# Patient Record
Sex: Male | Born: 1935
Health system: Southern US, Community
[De-identification: ages and names within clinical notes are randomized; demographics above are authoritative.]

## PROBLEM LIST (undated history)

## (undated) DIAGNOSIS — E559 Vitamin D deficiency, unspecified: Secondary | ICD-10-CM

## (undated) DIAGNOSIS — R39198 Other difficulties with micturition: Secondary | ICD-10-CM

## (undated) DIAGNOSIS — K259 Gastric ulcer, unspecified as acute or chronic, without hemorrhage or perforation: Secondary | ICD-10-CM

## (undated) DIAGNOSIS — F419 Anxiety disorder, unspecified: Secondary | ICD-10-CM

## (undated) DIAGNOSIS — M720 Palmar fascial fibromatosis [Dupuytren]: Secondary | ICD-10-CM

## (undated) DIAGNOSIS — M549 Dorsalgia, unspecified: Secondary | ICD-10-CM

## (undated) DIAGNOSIS — G4700 Insomnia, unspecified: Secondary | ICD-10-CM

## (undated) DIAGNOSIS — L74519 Primary focal hyperhidrosis, unspecified: Secondary | ICD-10-CM

## (undated) DIAGNOSIS — I10 Essential (primary) hypertension: Secondary | ICD-10-CM

## (undated) DIAGNOSIS — K5790 Diverticulosis of intestine, part unspecified, without perforation or abscess without bleeding: Secondary | ICD-10-CM

## (undated) DIAGNOSIS — M533 Sacrococcygeal disorders, not elsewhere classified: Secondary | ICD-10-CM

## (undated) DIAGNOSIS — E785 Hyperlipidemia, unspecified: Secondary | ICD-10-CM

## (undated) DIAGNOSIS — I440 Atrioventricular block, first degree: Secondary | ICD-10-CM

## (undated) DIAGNOSIS — L03119 Cellulitis of unspecified part of limb: Secondary | ICD-10-CM

## (undated) DIAGNOSIS — I2510 Atherosclerotic heart disease of native coronary artery without angina pectoris: Secondary | ICD-10-CM

## (undated) DIAGNOSIS — K645 Perianal venous thrombosis: Secondary | ICD-10-CM

## (undated) DIAGNOSIS — L570 Actinic keratosis: Secondary | ICD-10-CM

## (undated) DIAGNOSIS — H9190 Unspecified hearing loss, unspecified ear: Secondary | ICD-10-CM

## (undated) HISTORY — DX: Unspecified hearing loss, unspecified ear: H91.90

## (undated) HISTORY — DX: Anxiety disorder, unspecified: F41.9

## (undated) HISTORY — DX: Other difficulties with micturition: R39.198

## (undated) HISTORY — DX: Hyperlipidemia, unspecified: E78.5

## (undated) HISTORY — DX: Perianal venous thrombosis: K64.5

## (undated) HISTORY — DX: Dorsalgia, unspecified: M54.9

## (undated) HISTORY — DX: Diverticulosis of intestine, part unspecified, without perforation or abscess without bleeding: K57.90

## (undated) HISTORY — DX: Sacrococcygeal disorders, not elsewhere classified: M53.3

## (undated) HISTORY — DX: Vitamin D deficiency, unspecified: E55.9

## (undated) HISTORY — DX: Essential (primary) hypertension: I10

## (undated) HISTORY — DX: Primary focal hyperhidrosis, unspecified: L74.519

## (undated) HISTORY — DX: Palmar fascial fibromatosis (dupuytren): M72.0

## (undated) HISTORY — DX: Gastric ulcer, unspecified as acute or chronic, without hemorrhage or perforation: K25.9

## (undated) HISTORY — DX: Cellulitis of unspecified part of limb: L03.119

## (undated) HISTORY — DX: Actinic keratosis: L57.0

## (undated) HISTORY — DX: Atrioventricular block, first degree: I44.0

## (undated) HISTORY — DX: Atherosclerotic heart disease of native coronary artery without angina pectoris: I25.10

## (undated) HISTORY — DX: Insomnia, unspecified: G47.00

---

## 1969-06-25 HISTORY — PX: RECTAL POLYPECTOMY: SHX2309

## 1978-06-25 DIAGNOSIS — K5790 Diverticulosis of intestine, part unspecified, without perforation or abscess without bleeding: Secondary | ICD-10-CM

## 1978-06-25 HISTORY — DX: Diverticulosis of intestine, part unspecified, without perforation or abscess without bleeding: K57.90

## 1988-06-25 HISTORY — PX: CATARACT EXTRACTION W/ INTRAOCULAR LENS IMPLANT: SHX1309

## 1990-06-25 HISTORY — PX: CATARACT EXTRACTION W/ INTRAOCULAR LENS IMPLANT: SHX1309

## 1996-06-25 HISTORY — PX: CORONARY ARTERY BYPASS GRAFT: SHX141

## 2001-04-30 DIAGNOSIS — K645 Perianal venous thrombosis: Secondary | ICD-10-CM

## 2001-04-30 HISTORY — DX: Perianal venous thrombosis: K64.5

## 2001-06-25 HISTORY — PX: COLONOSCOPY: SHX174

## 2001-08-20 ENCOUNTER — Ambulatory Visit (HOSPITAL_COMMUNITY): Admission: RE | Admit: 2001-08-20 | Discharge: 2001-08-20 | Payer: Self-pay | Admitting: *Deleted

## 2001-10-29 ENCOUNTER — Ambulatory Visit (HOSPITAL_COMMUNITY): Admission: RE | Admit: 2001-10-29 | Discharge: 2001-10-29 | Payer: Self-pay | Admitting: *Deleted

## 2001-12-30 DIAGNOSIS — K259 Gastric ulcer, unspecified as acute or chronic, without hemorrhage or perforation: Secondary | ICD-10-CM

## 2001-12-30 HISTORY — DX: Gastric ulcer, unspecified as acute or chronic, without hemorrhage or perforation: K25.9

## 2005-01-16 DIAGNOSIS — H9190 Unspecified hearing loss, unspecified ear: Secondary | ICD-10-CM

## 2005-01-16 HISTORY — DX: Unspecified hearing loss, unspecified ear: H91.90

## 2007-08-04 DIAGNOSIS — R39198 Other difficulties with micturition: Secondary | ICD-10-CM

## 2007-08-04 HISTORY — DX: Other difficulties with micturition: R39.198

## 2007-08-06 DIAGNOSIS — L74519 Primary focal hyperhidrosis, unspecified: Secondary | ICD-10-CM

## 2007-08-06 DIAGNOSIS — M720 Palmar fascial fibromatosis [Dupuytren]: Secondary | ICD-10-CM

## 2007-08-06 DIAGNOSIS — I440 Atrioventricular block, first degree: Secondary | ICD-10-CM

## 2007-08-06 HISTORY — DX: Palmar fascial fibromatosis (dupuytren): M72.0

## 2007-08-06 HISTORY — DX: Atrioventricular block, first degree: I44.0

## 2007-08-06 HISTORY — DX: Primary focal hyperhidrosis, unspecified: L74.519

## 2009-05-27 DIAGNOSIS — E559 Vitamin D deficiency, unspecified: Secondary | ICD-10-CM

## 2009-05-27 HISTORY — DX: Vitamin D deficiency, unspecified: E55.9

## 2009-08-03 DIAGNOSIS — L570 Actinic keratosis: Secondary | ICD-10-CM

## 2009-08-03 HISTORY — DX: Actinic keratosis: L57.0

## 2010-01-31 DIAGNOSIS — M533 Sacrococcygeal disorders, not elsewhere classified: Secondary | ICD-10-CM

## 2010-01-31 HISTORY — DX: Sacrococcygeal disorders, not elsewhere classified: M53.3

## 2010-08-24 ENCOUNTER — Ambulatory Visit
Admission: RE | Admit: 2010-08-24 | Discharge: 2010-08-24 | Disposition: A | Discharge status: Home / Self Care | Payer: Medicare Other | Source: Ambulatory Visit | Attending: Internal Medicine | Admitting: Internal Medicine

## 2010-08-24 ENCOUNTER — Other Ambulatory Visit: Payer: Self-pay | Admitting: Internal Medicine

## 2010-08-24 DIAGNOSIS — J40 Bronchitis, not specified as acute or chronic: Secondary | ICD-10-CM

## 2010-12-13 ENCOUNTER — Other Ambulatory Visit: Payer: Self-pay | Admitting: Internal Medicine

## 2010-12-13 ENCOUNTER — Ambulatory Visit
Admission: RE | Admit: 2010-12-13 | Discharge: 2010-12-13 | Disposition: A | Discharge status: Home / Self Care | Payer: Medicare Other | Source: Ambulatory Visit | Attending: Internal Medicine | Admitting: Internal Medicine

## 2010-12-13 DIAGNOSIS — Z09 Encounter for follow-up examination after completed treatment for conditions other than malignant neoplasm: Secondary | ICD-10-CM

## 2012-09-19 IMAGING — CR DG CHEST 2V
2 series · 2 of 2 positions shown · non-contrast
Comparison: 08/24/2010.

CLINICAL DATA: 85-year-old male 3-month follow-up of indistinct
left lung base opacity.  Recent fall with anterior ribs soreness.

CHEST - 2 VIEW

[view not recorded (1 of 2)]
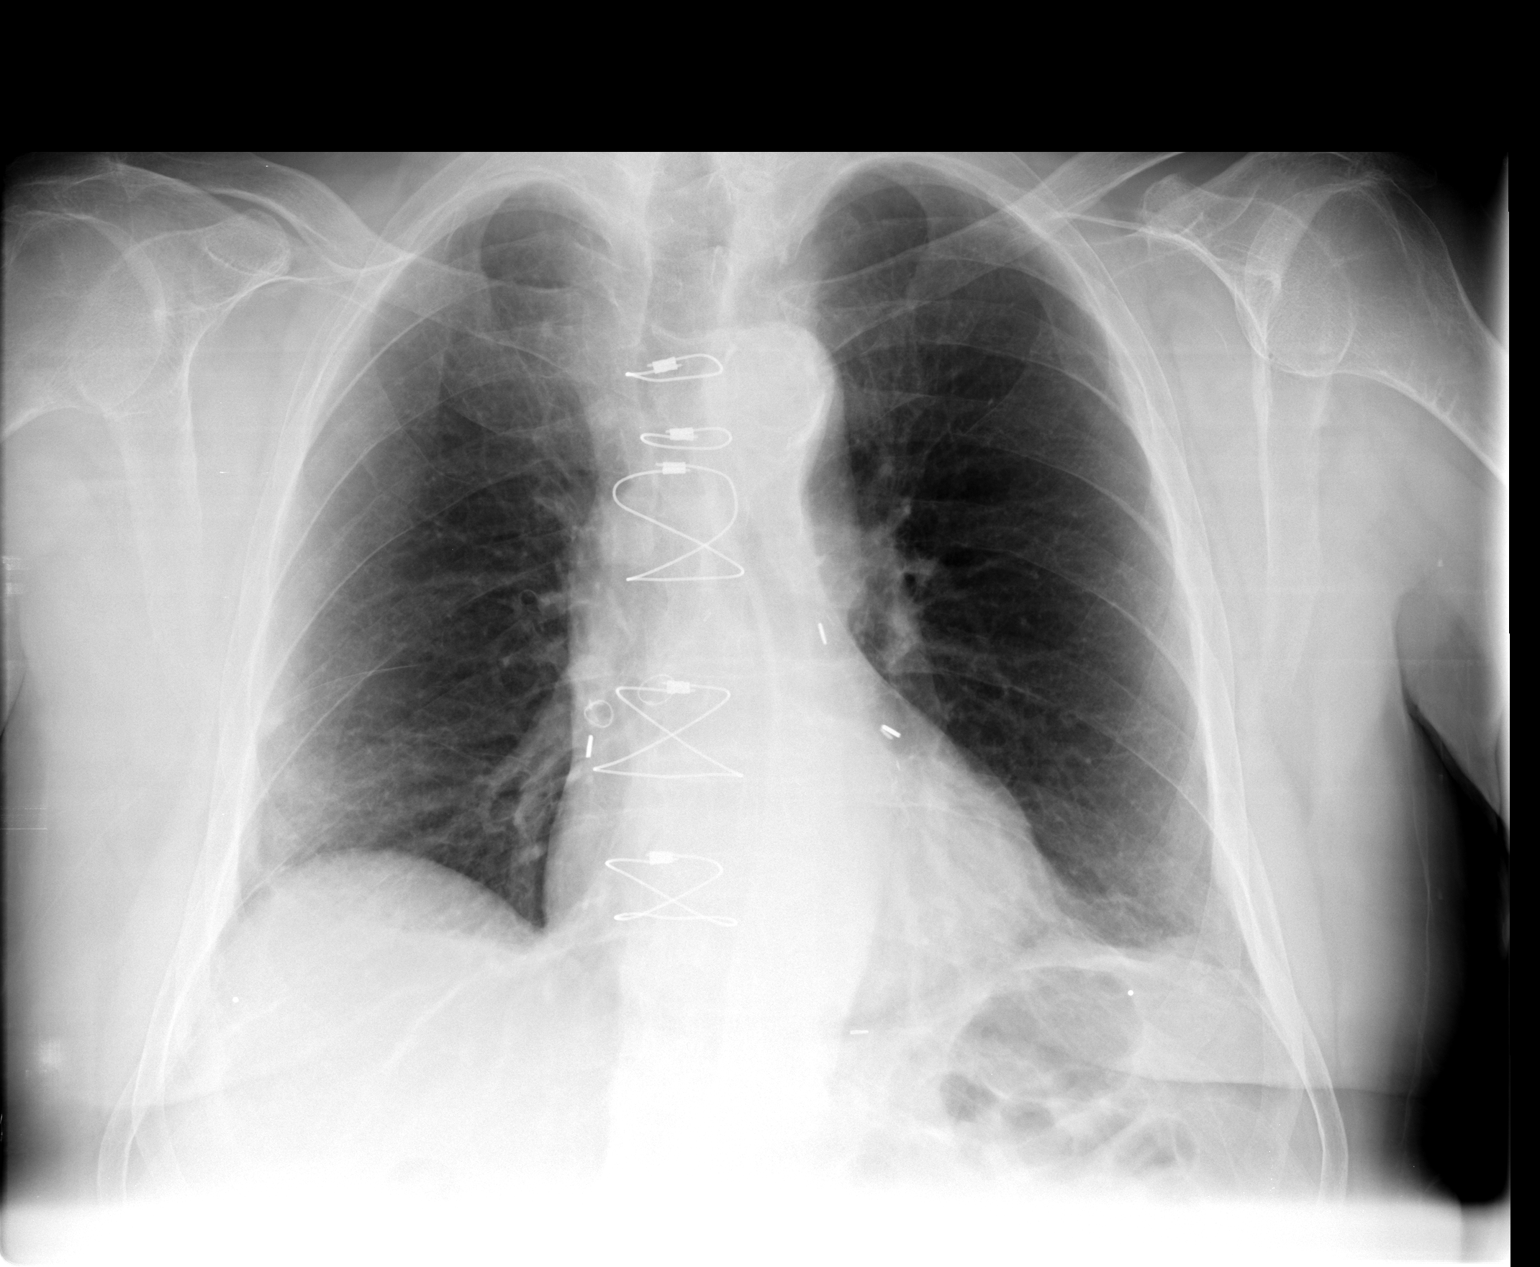

[view not recorded (2 of 2)]
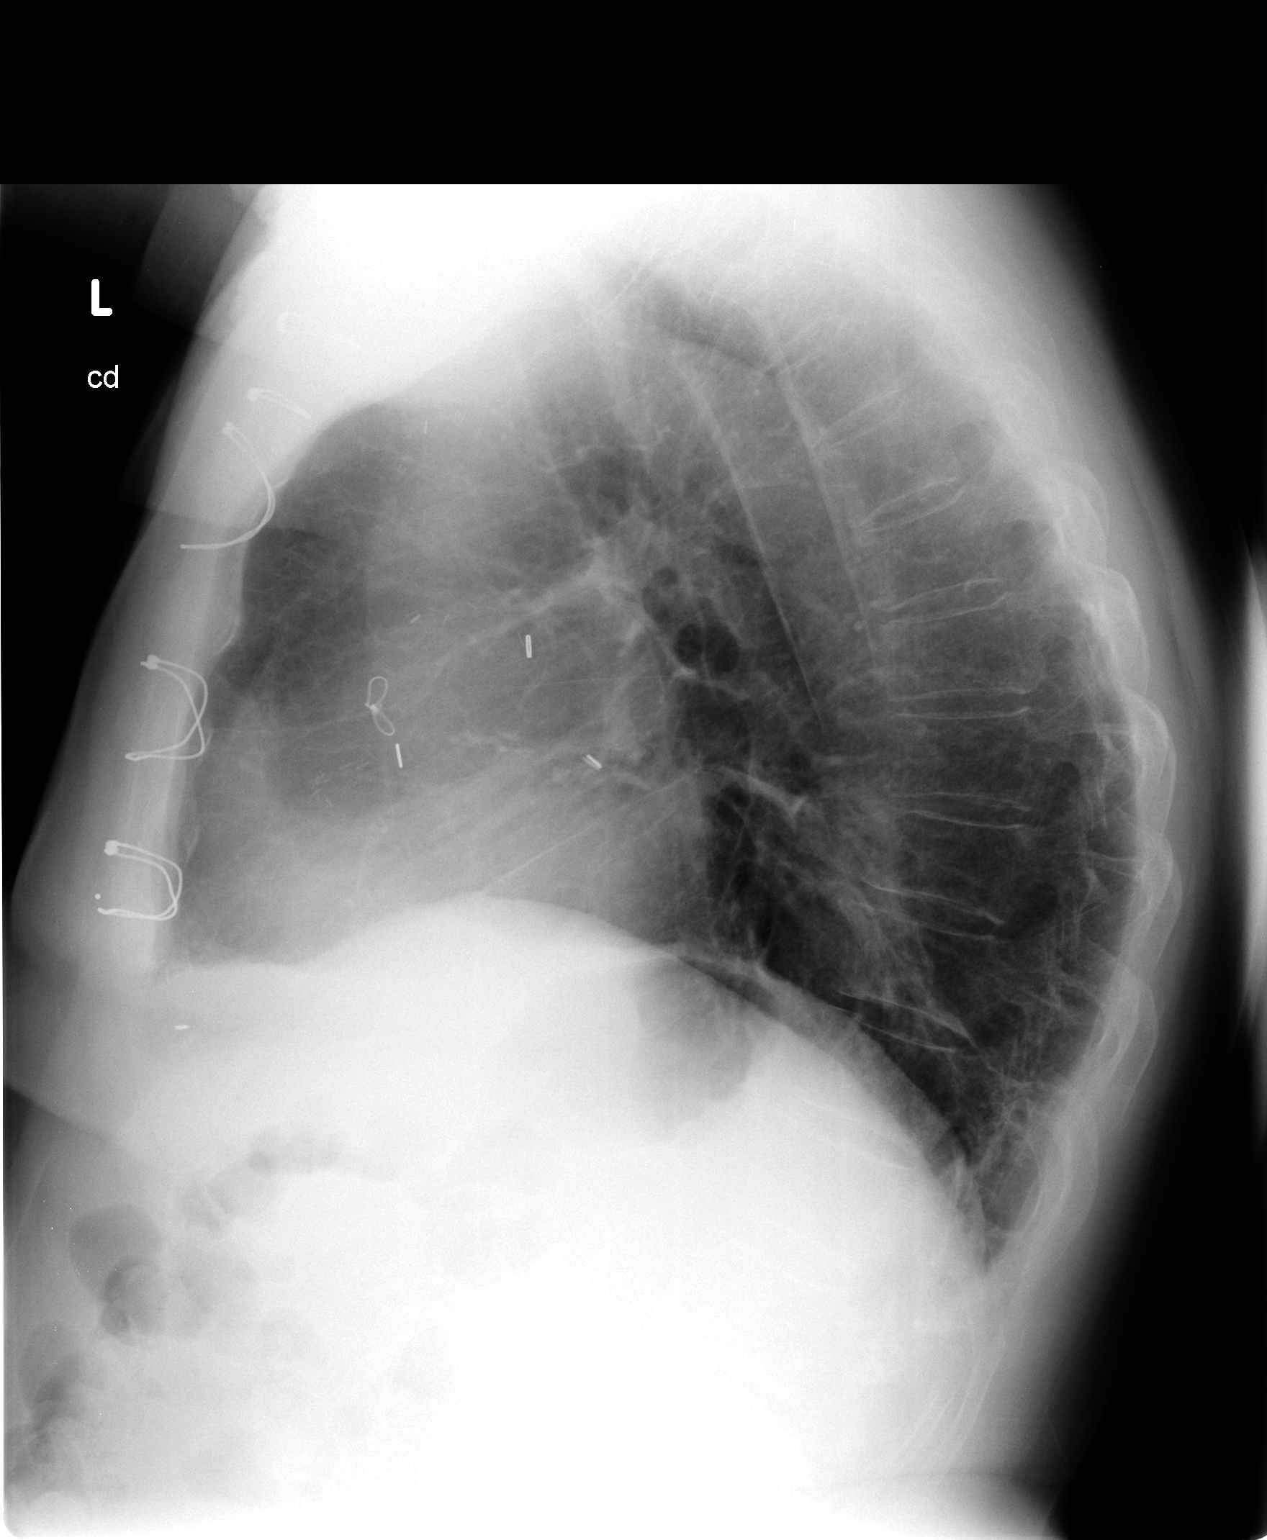

[2 of 2 positions shown; findings below may reference images not displayed]

FINDINGS: PA and lateral views of the chest with nipple markers.
The left nipple marker is several centimeters below the region was
questioned on the prior study, however, that previously questioned
confluent opacity has abated.  There is basilar predominant
reticular opacity at both lung bases, but no discrete pulmonary
nodule or mass on the frontal view.  Lung volumes are lower, with
crowding of the lower lobe bronchovascular markings on the lateral.
Sequelae of CABG.  Stable cardiac size and mediastinal contours.
No pneumothorax, pulmonary edema or pleural effusion.  Osteopenia.
Stable visualized osseous structures.
IMPRESSION: Lower lung volumes with basilar scarring and/or atelectasis.  There
previously questioned density at the left peripheral base does not
persist.  I feel that no additional dedicated follow-up of that
opacity is necessary.

## 2013-08-25 DIAGNOSIS — L821 Other seborrheic keratosis: Secondary | ICD-10-CM | POA: Diagnosis not present

## 2013-08-25 DIAGNOSIS — L570 Actinic keratosis: Secondary | ICD-10-CM | POA: Diagnosis not present

## 2013-09-18 DIAGNOSIS — I2510 Atherosclerotic heart disease of native coronary artery without angina pectoris: Secondary | ICD-10-CM | POA: Diagnosis not present

## 2013-09-18 DIAGNOSIS — F411 Generalized anxiety disorder: Secondary | ICD-10-CM | POA: Diagnosis not present

## 2013-09-18 DIAGNOSIS — E785 Hyperlipidemia, unspecified: Secondary | ICD-10-CM | POA: Diagnosis not present

## 2013-09-18 DIAGNOSIS — G4700 Insomnia, unspecified: Secondary | ICD-10-CM | POA: Diagnosis not present

## 2013-09-18 DIAGNOSIS — I10 Essential (primary) hypertension: Secondary | ICD-10-CM | POA: Diagnosis not present

## 2013-12-29 DIAGNOSIS — E785 Hyperlipidemia, unspecified: Secondary | ICD-10-CM | POA: Diagnosis not present

## 2013-12-29 DIAGNOSIS — I2510 Atherosclerotic heart disease of native coronary artery without angina pectoris: Secondary | ICD-10-CM | POA: Diagnosis not present

## 2013-12-29 DIAGNOSIS — F411 Generalized anxiety disorder: Secondary | ICD-10-CM | POA: Diagnosis not present

## 2013-12-29 DIAGNOSIS — G4700 Insomnia, unspecified: Secondary | ICD-10-CM | POA: Diagnosis not present

## 2013-12-29 DIAGNOSIS — I10 Essential (primary) hypertension: Secondary | ICD-10-CM | POA: Diagnosis not present

## 2014-03-12 DIAGNOSIS — I2510 Atherosclerotic heart disease of native coronary artery without angina pectoris: Secondary | ICD-10-CM | POA: Diagnosis not present

## 2014-03-12 DIAGNOSIS — J328 Other chronic sinusitis: Secondary | ICD-10-CM | POA: Diagnosis not present

## 2014-04-06 DIAGNOSIS — Z23 Encounter for immunization: Secondary | ICD-10-CM | POA: Diagnosis not present

## 2014-04-06 DIAGNOSIS — M549 Dorsalgia, unspecified: Secondary | ICD-10-CM | POA: Diagnosis not present

## 2014-04-06 DIAGNOSIS — I2510 Atherosclerotic heart disease of native coronary artery without angina pectoris: Secondary | ICD-10-CM | POA: Diagnosis not present

## 2014-04-06 DIAGNOSIS — F411 Generalized anxiety disorder: Secondary | ICD-10-CM | POA: Diagnosis not present

## 2014-04-06 DIAGNOSIS — I25810 Atherosclerosis of coronary artery bypass graft(s) without angina pectoris: Secondary | ICD-10-CM | POA: Diagnosis not present

## 2014-04-06 DIAGNOSIS — I10 Essential (primary) hypertension: Secondary | ICD-10-CM | POA: Diagnosis not present

## 2014-04-06 DIAGNOSIS — E785 Hyperlipidemia, unspecified: Secondary | ICD-10-CM | POA: Diagnosis not present

## 2014-07-08 DIAGNOSIS — L821 Other seborrheic keratosis: Secondary | ICD-10-CM | POA: Diagnosis not present

## 2014-07-08 DIAGNOSIS — L570 Actinic keratosis: Secondary | ICD-10-CM | POA: Diagnosis not present

## 2014-07-26 DIAGNOSIS — E785 Hyperlipidemia, unspecified: Secondary | ICD-10-CM | POA: Diagnosis not present

## 2014-07-26 DIAGNOSIS — I25810 Atherosclerosis of coronary artery bypass graft(s) without angina pectoris: Secondary | ICD-10-CM | POA: Diagnosis not present

## 2014-07-26 DIAGNOSIS — M549 Dorsalgia, unspecified: Secondary | ICD-10-CM | POA: Diagnosis not present

## 2014-07-26 DIAGNOSIS — I10 Essential (primary) hypertension: Secondary | ICD-10-CM | POA: Diagnosis not present

## 2014-10-25 DIAGNOSIS — I25810 Atherosclerosis of coronary artery bypass graft(s) without angina pectoris: Secondary | ICD-10-CM | POA: Diagnosis not present

## 2014-10-25 DIAGNOSIS — M545 Low back pain: Secondary | ICD-10-CM | POA: Diagnosis not present

## 2014-10-25 DIAGNOSIS — I10 Essential (primary) hypertension: Secondary | ICD-10-CM | POA: Diagnosis not present

## 2014-10-25 DIAGNOSIS — R0602 Shortness of breath: Secondary | ICD-10-CM | POA: Diagnosis not present

## 2014-10-25 DIAGNOSIS — F419 Anxiety disorder, unspecified: Secondary | ICD-10-CM | POA: Diagnosis not present

## 2014-10-25 DIAGNOSIS — F5101 Primary insomnia: Secondary | ICD-10-CM | POA: Diagnosis not present

## 2014-10-25 DIAGNOSIS — I2510 Atherosclerotic heart disease of native coronary artery without angina pectoris: Secondary | ICD-10-CM | POA: Diagnosis not present

## 2014-10-25 DIAGNOSIS — E785 Hyperlipidemia, unspecified: Secondary | ICD-10-CM | POA: Diagnosis not present

## 2014-10-25 LAB — BASIC METABOLIC PANEL
BUN: 21 mg/dL (ref 4–21)
Creatinine: 1.1 mg/dL (ref 0.6–1.3)
GLUCOSE: 98 mg/dL
POTASSIUM: 4.6 mmol/L (ref 3.4–5.3)
Sodium: 141 mmol/L (ref 137–147)

## 2014-10-25 LAB — CBC AND DIFFERENTIAL
HCT: 40 % — AB (ref 41–53)
HEMOGLOBIN: 13.1 g/dL — AB (ref 13.5–17.5)
Platelets: 222 10*3/uL (ref 150–399)
WBC: 10.7 10*3/mL

## 2014-10-25 LAB — LIPID PANEL
Cholesterol: 162 mg/dL (ref 0–200)
HDL: 41 mg/dL (ref 35–70)
LDL Cholesterol: 45 mg/dL
Triglycerides: 380 mg/dL — AB (ref 40–160)

## 2014-10-25 LAB — HEPATIC FUNCTION PANEL
ALT: 16 U/L (ref 10–40)
AST: 19 U/L (ref 14–40)
Alkaline Phosphatase: 65 U/L (ref 25–125)
Bilirubin, Total: 0.2 mg/dL

## 2014-10-25 LAB — TSH: TSH: 2.42 u[IU]/mL (ref 0.41–5.90)

## 2015-01-11 DIAGNOSIS — L03119 Cellulitis of unspecified part of limb: Secondary | ICD-10-CM

## 2015-01-11 DIAGNOSIS — I25810 Atherosclerosis of coronary artery bypass graft(s) without angina pectoris: Secondary | ICD-10-CM | POA: Diagnosis not present

## 2015-01-11 DIAGNOSIS — R600 Localized edema: Secondary | ICD-10-CM | POA: Diagnosis not present

## 2015-01-11 HISTORY — DX: Cellulitis of unspecified part of limb: L03.119

## 2015-01-20 DIAGNOSIS — Z23 Encounter for immunization: Secondary | ICD-10-CM | POA: Diagnosis not present

## 2015-01-20 DIAGNOSIS — R600 Localized edema: Secondary | ICD-10-CM | POA: Diagnosis not present

## 2015-01-20 DIAGNOSIS — I10 Essential (primary) hypertension: Secondary | ICD-10-CM | POA: Diagnosis not present

## 2015-01-20 DIAGNOSIS — L03119 Cellulitis of unspecified part of limb: Secondary | ICD-10-CM | POA: Diagnosis not present

## 2015-01-20 DIAGNOSIS — I25810 Atherosclerosis of coronary artery bypass graft(s) without angina pectoris: Secondary | ICD-10-CM | POA: Diagnosis not present

## 2015-01-25 ENCOUNTER — Encounter: Payer: Self-pay | Admitting: Internal Medicine

## 2015-02-08 ENCOUNTER — Non-Acute Institutional Stay: Payer: Medicare Other | Admitting: Internal Medicine

## 2015-02-08 ENCOUNTER — Encounter: Payer: Self-pay | Admitting: Internal Medicine

## 2015-02-08 VITALS — BP 148/80 | HR 68 | Temp 97.8°F | Ht 69.0 in | Wt 203.0 lb

## 2015-02-08 DIAGNOSIS — W57XXXA Bitten or stung by nonvenomous insect and other nonvenomous arthropods, initial encounter: Secondary | ICD-10-CM

## 2015-02-08 DIAGNOSIS — I10 Essential (primary) hypertension: Secondary | ICD-10-CM | POA: Diagnosis not present

## 2015-02-08 DIAGNOSIS — F419 Anxiety disorder, unspecified: Secondary | ICD-10-CM | POA: Insufficient documentation

## 2015-02-08 DIAGNOSIS — B353 Tinea pedis: Secondary | ICD-10-CM | POA: Diagnosis not present

## 2015-02-08 DIAGNOSIS — M549 Dorsalgia, unspecified: Secondary | ICD-10-CM | POA: Insufficient documentation

## 2015-02-08 DIAGNOSIS — G4700 Insomnia, unspecified: Secondary | ICD-10-CM | POA: Insufficient documentation

## 2015-02-08 DIAGNOSIS — H9190 Unspecified hearing loss, unspecified ear: Secondary | ICD-10-CM | POA: Insufficient documentation

## 2015-02-08 DIAGNOSIS — T148 Other injury of unspecified body region: Secondary | ICD-10-CM | POA: Diagnosis not present

## 2015-02-08 DIAGNOSIS — E785 Hyperlipidemia, unspecified: Secondary | ICD-10-CM

## 2015-02-08 DIAGNOSIS — H9193 Unspecified hearing loss, bilateral: Secondary | ICD-10-CM | POA: Diagnosis not present

## 2015-02-08 DIAGNOSIS — I2510 Atherosclerotic heart disease of native coronary artery without angina pectoris: Secondary | ICD-10-CM | POA: Diagnosis not present

## 2015-02-08 DIAGNOSIS — I872 Venous insufficiency (chronic) (peripheral): Secondary | ICD-10-CM | POA: Diagnosis not present

## 2015-02-08 DIAGNOSIS — R609 Edema, unspecified: Secondary | ICD-10-CM | POA: Diagnosis not present

## 2015-02-08 MED ORDER — TERBINAFINE HCL 1 % EX CREA: TOPICAL_CREAM | CUTANEOUS | Status: DC

## 2015-02-08 MED ORDER — ENALAPRIL MALEATE 10 MG PO TABS: ORAL_TABLET | ORAL | Status: DC

## 2015-02-08 NOTE — Progress Notes (Signed)
Patient ID: Samuel Clayton, male   DOB: [birth year], 81 y.o.   MRN: [id number]     [facility]     Place of Service: Clinic (12)     No Known Allergies  Chief Complaint  Patient presents with  . Medical Management of Chronic Issues    Re-est with Dr. Chilton Si. Was seeing Dr. Pricilla Holm in [place], lives at [facility] now.   . Leg Swelling    and ankles bilateral with reddness since he moved to [facility].  Has had a rash for 1 1/2 years on legs.    HPI:  Edema: Legs have had chronic swelling. Increased a couple months ago. Also had increased erythema. Was put on Keflex for several days and that seemed to help the itching and rash that accompanied the swelling at that time. He was also taken off of amlodipine. In the meantime he also stopped fosinopril. He thinks his swelling has improved since stopping these medications. Looking through old notes, he has had chronic mild edema in the past. He is also status post harvesting of saphenous vein for coronary artery bypass grafting in the past.  HLD (hyperlipidemia): No recent lab  Arteriosclerosis of coronary artery: Denies chest pain, palpitations, dyspnea, or tightness in the chest.  Venous (peripheral) insufficiency: Contributes to chronic peripheral edema  Hearing loss, bilateral: Chronic and unchanged.  Essential hypertension: Systolic pressure has risen since being off the amlodipine and fosinopril. Denies headache or chest pain.  Tinea pedis of both feet: Contributes to the burning sensation of both feet  Insect bite: About 4 days ago had 7 welts appear on his right upper arm and feels that he had an insect bite. He is suspicious of spider bite occurred.    Medications: Patient's Medications  New Prescriptions   No medications on file  Previous Medications   ASPIRIN 81 MG TABLET    Take 81 mg by mouth daily.   CALCIUM-VITAMIN D (OSCAL WITH D) 500-200 MG-UNIT PER TABLET    Take 1 tablet by mouth.    CARISOPRODOL  (SOMA) 350 MG TABLET    Take 350 mg by mouth. Take one tablet daily as needed for muscle spasms   FOSINOPRIL (MONOPRIL) 40 MG TABLET    Take 40 mg by mouth. Take one tablet daily for blood pressure   FUROSEMIDE (LASIX) 20 MG TABLET    Take 20 mg by mouth.   HYDROCORTISONE 2.5 % CREAM    Apply topically. Apply one application topically twice daily   LORAZEPAM (ATIVAN) 1 MG TABLET    Take 1 mg by mouth. Take one tablet every 6 hours as needed for anxiety   METOPROLOL SUCCINATE (TOPROL-XL) 50 MG 24 HR TABLET    Take 50 mg by mouth daily. Take one tablet twice a day for blood pressure   ROSUVASTATIN (CRESTOR) 10 MG TABLET    Take 10 mg by mouth. Take one tablet daily for crestor  Modified Medications   No medications on file  Discontinued Medications   FUROSEMIDE (LASIX) 20 MG TABLET    Take 20 mg by mouth. Take one tablet daily for edema     Review of Systems  Constitutional: Negative for fever, activity change, appetite change, fatigue and unexpected weight change.  HENT: Positive for hearing loss. Negative for congestion, ear pain, rhinorrhea, sore throat, tinnitus, trouble swallowing and voice change.   Eyes:       Corrective lenses  Respiratory: Negative for cough, choking, chest tightness, shortness of breath  and wheezing.   Cardiovascular: Negative for chest pain, palpitations and leg swelling.  Gastrointestinal: Positive for constipation. Negative for nausea, abdominal pain, diarrhea and abdominal distention.  Endocrine: Negative for cold intolerance, heat intolerance, polydipsia, polyphagia and polyuria.  Genitourinary: Negative for dysuria, urgency, frequency and testicular pain.       Not incontinent  Musculoskeletal: Positive for back pain. Negative for myalgias, arthralgias, gait problem and neck pain.  Skin: Negative for color change, pallor and rash.  Allergic/Immunologic: Negative.   Neurological: Negative for dizziness, tremors, syncope, speech difficulty, weakness, numbness  and headaches.  Hematological: Negative for adenopathy. Does not bruise/bleed easily.  Psychiatric/Behavioral: Positive for sleep disturbance (difficulty falling asleep). Negative for hallucinations, behavioral problems, confusion and decreased concentration. The patient is not nervous/anxious.     Filed Vitals:   02/08/15 0856  BP: 148/80  Pulse: 68  Temp: 97.8 F (36.6 C)  TempSrc: Oral  Height: 5\' 9"  (1.753 m)  Weight: 203 lb (92.08 kg)  SpO2: 94%   Body mass index is 29.96 kg/(m^2).  Physical Exam  Constitutional: He is oriented to person, place, and time. He appears well-developed and well-nourished. No distress.  moderately overweight  HENT:  Right Ear: External ear normal.  Left Ear: External ear normal.  Nose: Nose normal.  Mouth/Throat: Oropharynx is clear and moist. No oropharyngeal exudate.  Bilateral hearing loss  Eyes: Conjunctivae and EOM are normal. Pupils are equal, round, and reactive to light.  Neck: No JVD present. No tracheal deviation present. No thyromegaly present.  Cardiovascular: Normal rate, regular rhythm, normal heart sounds and intact distal pulses.  Exam reveals no gallop and no friction rub.   No murmur heard. Pulmonary/Chest: No respiratory distress. He has no wheezes. He has no rales. He exhibits no tenderness.  Abdominal: He exhibits no distension and no mass. There is no tenderness.  Genitourinary:  hemorrhoid  Musculoskeletal: Normal range of motion. He exhibits no edema or tenderness.  Duypuytren's contracture of the 4th finger bilaterally  Lymphadenopathy:    He has no cervical adenopathy.  Neurological: He is alert and oriented to person, place, and time. He has normal reflexes. No cranial nerve deficit. Coordination normal.  07/30/14 MMSE 26/30. Passed clock drawing.  Skin: No rash noted. No erythema. No pallor.  Itchy. Recent insect bites of the upper right arm. Itching and burning feet with multiple scaling lesions typical of athlete's  foot.  Psychiatric: He has a normal mood and affect. His behavior is normal. Judgment and thought content normal.     Labs reviewed: Abstract on 02/07/2015  Component Date Value Ref Range Status  . Hemoglobin 10/25/2014 13.1* 13.5 - 17.5 g/dL Final  . HCT 16/03/9603 40* 41 - 53 % Final  . Platelets 10/25/2014 222  150 - 399 K/L Final  . WBC 10/25/2014 10.7   Final  . Glucose 10/25/2014 98   Final  . BUN 10/25/2014 21  4 - 21 mg/dL Final  . Creatinine 54/02/8118 1.1  0.6 - 1.3 mg/dL Final  . Potassium 14/78/2956 4.6  3.4 - 5.3 mmol/L Final  . Sodium 10/25/2014 141  137 - 147 mmol/L Final  . Triglycerides 10/25/2014 380* 40 - 160 mg/dL Final  . Cholesterol 21/30/8657 162  0 - 200 mg/dL Final  . HDL 84/69/6295 41  35 - 70 mg/dL Final  . LDL Cholesterol 10/25/2014 45   Final  . Alkaline Phosphatase 10/25/2014 65  25 - 125 U/L Final  . ALT 10/25/2014 16  10 - 40 U/L  Final  . AST 10/25/2014 19  14 - 40 U/L Final  . Bilirubin, Total 10/25/2014 0.2   Final  . TSH 10/25/2014 2.42  0.41 - 5.90 uIU/mL Final     Assessment/Plan  1. Edema Remain off amlodipine and lisinopril. - furosemide (LASIX) 20 MG tablet; Take 20 mg by mouth.  2. HLD (hyperlipidemia) -Lipid panel next visit  3. Arteriosclerosis of coronary artery Stable. Asymptomatic.  4. Venous (peripheral) insufficiency Contributes to peripheral edema  5. Hearing loss, bilateral Unchanged  6. Essential hypertension -Resume furosemide 20 mg daily - enalapril (VASOTEC) 10 MG tablet; One daily to control BP and to strengthen the heart  Dispense: 90 tablet; Refill: 4  7. Tinea pedis of both feet - terbinafine (LAMISIL AT) 1 % cream; Apply daily to rash on foot  Dispense: 30 g; Refill: 0  8. Insect bite -Apply hydrocortisone cream twice daily to lesions.

## 2015-03-22 ENCOUNTER — Non-Acute Institutional Stay: Payer: Medicare Other | Admitting: Internal Medicine

## 2015-03-22 VITALS — BP 120/70 | HR 71 | Wt 203.4 lb

## 2015-03-22 DIAGNOSIS — L299 Pruritus, unspecified: Secondary | ICD-10-CM

## 2015-03-22 DIAGNOSIS — F411 Generalized anxiety disorder: Secondary | ICD-10-CM

## 2015-03-22 DIAGNOSIS — R609 Edema, unspecified: Secondary | ICD-10-CM

## 2015-03-22 DIAGNOSIS — L989 Disorder of the skin and subcutaneous tissue, unspecified: Secondary | ICD-10-CM | POA: Diagnosis not present

## 2015-03-22 DIAGNOSIS — I2510 Atherosclerotic heart disease of native coronary artery without angina pectoris: Secondary | ICD-10-CM

## 2015-03-22 MED ORDER — LORAZEPAM 1 MG PO TABS: 1.0000 mg | ORAL_TABLET | Freq: Four times a day (QID) | ORAL | Status: DC | PRN

## 2015-03-22 MED ORDER — HYDROCORTISONE 2.5 % EX CREA: TOPICAL_CREAM | Freq: Two times a day (BID) | CUTANEOUS | Status: DC

## 2015-03-22 MED ORDER — FUROSEMIDE 40 MG PO TABS: ORAL_TABLET | ORAL | Status: DC

## 2015-03-22 NOTE — Progress Notes (Signed)
Patient ID: Samuel Clayton, male   DOB: [birth year], 81 y.o.   MRN: [id number]    [place] of Service: Clinic (12)     No Known Allergies  Chief Complaint  Patient presents with  . Medical Management of Chronic Issues  . Acute Visit    swelling ankles, and itching, back is itching,    HPI:  Edema; not much improvement in edema 20 mg daily furosemide. Patient believes that compression stockings created some of the problems of itching and small lesions of the skin distally on the legs. He is reluctant to resuming compression stockings. He would prefer to try a higher dose of Lasix.  Anxiety state - benefits from LORazepam (ATIVAN) 1 MG tablet  Itching - benefits from hydrocortisone 2.5 % cream  Skin lesion of back - he is aware to pruritic areas on his back. One is at the mid back area and the other one is at the left scapular area.    Medications: Patient's Medications  New Prescriptions   No medications on file  Previous Medications   ASPIRIN 81 MG TABLET    Take 81 mg by mouth daily.   CALCIUM-VITAMIN D (OSCAL WITH D) 500-200 MG-UNIT PER TABLET    Take 1 tablet by mouth.    CARISOPRODOL (SOMA) 350 MG TABLET    Take 350 mg by mouth. Take one tablet daily as needed for muscle spasms   ENALAPRIL (VASOTEC) 10 MG TABLET    One daily to control BP and to strengthen the heart   FUROSEMIDE (LASIX) 20 MG TABLET    Take 20 mg by mouth.   HYDROCORTISONE 2.5 % CREAM    Apply topically. Apply one application topically twice daily   LORAZEPAM (ATIVAN) 1 MG TABLET    Take 1 mg by mouth. Take one tablet every 6 hours as needed for anxiety   METOPROLOL SUCCINATE (TOPROL-XL) 50 MG 24 HR TABLET    Take 50 mg by mouth daily. Take one tablet twice a day for blood pressure   ROSUVASTATIN (CRESTOR) 10 MG TABLET    Take 10 mg by mouth. Take one tablet daily for crestor   TERBINAFINE (LAMISIL AT) 1 % CREAM    Apply daily to rash on foot  Modified Medications   No  medications on file  Discontinued Medications   No medications on file     Review of Systems  Constitutional: Negative for fever, activity change, appetite change, fatigue and unexpected weight change.  HENT: Positive for hearing loss. Negative for congestion, ear pain, rhinorrhea, sore throat, tinnitus, trouble swallowing and voice change.   Eyes:       Corrective lenses  Respiratory: Negative for cough, choking, chest tightness, shortness of breath and wheezing.   Cardiovascular: Negative for chest pain, palpitations and leg swelling.  Gastrointestinal: Positive for constipation. Negative for nausea, abdominal pain, diarrhea and abdominal distention.  Endocrine: Negative for cold intolerance, heat intolerance, polydipsia, polyphagia and polyuria.  Genitourinary: Negative for dysuria, urgency, frequency and testicular pain.       Not incontinent  Musculoskeletal: Positive for back pain. Negative for myalgias, arthralgias, gait problem and neck pain.  Skin: Negative for color change, pallor and rash.       Itching areas of the lower legs. Itching raised lesions of the mid back and left scapular areas.  Allergic/Immunologic: Negative.   Neurological: Negative for dizziness, tremors, syncope, speech difficulty, weakness, numbness and headaches.  Hematological: Negative for adenopathy.  Does not bruise/bleed easily.  Psychiatric/Behavioral: Positive for sleep disturbance (difficulty falling asleep). Negative for hallucinations, behavioral problems, confusion and decreased concentration. The patient is not nervous/anxious.     Filed Vitals:   03/22/15 1109  BP: 120/70  Pulse: 71  Weight: 203 lb 6.4 oz (92.262 kg)   Body mass index is 30.02 kg/(m^2).  Physical Exam  Constitutional: He is oriented to person, place, and time. He appears well-developed and well-nourished. No distress.  moderately overweight  HENT:  Right Ear: External ear normal.  Left Ear: External ear normal.  Nose:  Nose normal.  Mouth/Throat: Oropharynx is clear and moist. No oropharyngeal exudate.  Bilateral hearing loss  Eyes: Conjunctivae and EOM are normal. Pupils are equal, round, and reactive to light.  Neck: No JVD present. No tracheal deviation present. No thyromegaly present.  Cardiovascular: Normal rate, regular rhythm, normal heart sounds and intact distal pulses.  Exam reveals no gallop and no friction rub.   No murmur heard. Pulmonary/Chest: No respiratory distress. He has no wheezes. He has no rales. He exhibits no tenderness.  Abdominal: He exhibits no distension and no mass. There is no tenderness.  Genitourinary:  hemorrhoid  Musculoskeletal: Normal range of motion. He exhibits no edema or tenderness.  Duypuytren's contracture of the 4th finger bilaterally  Lymphadenopathy:    He has no cervical adenopathy.  Neurological: He is alert and oriented to person, place, and time. He has normal reflexes. No cranial nerve deficit. Coordination normal.  07/30/14 MMSE 26/30. Passed clock drawing.  Skin: No rash noted. No erythema. No pallor.  Itching and burning feet with multiple scaling lesions typical of athlete's foot. Itching lesions of the back. In the mid back area there is a raised area that has a central small thin scab. There is a slight erythematous coloration. At the left scapular area there is another raised lesion, slightly reddish, and with a thin scab-like area on the top of it.  Psychiatric: He has a normal mood and affect. His behavior is normal. Judgment and thought content normal.     Labs reviewed: Lab Summary Latest Ref Rng 10/25/2014  Hemoglobin 13.5 - 17.5 g/dL 13.1(A)  Hematocrit 41 - 53 % 40(A)  White count - 10.7  Platelet count 150 - 399 K/L 222  Sodium 137 - 147 mmol/L 141  Potassium 3.4 - 5.3 mmol/L 4.6  Calcium - (None)  Phosphorus - (None)  Creatinine 0.6 - 1.3 mg/dL 1.1  AST 14 - 40 U/L 19  Alk Phos 25 - 125 U/L 65  Bilirubin - (None)  Glucose - 98    Cholesterol 0 - 200 mg/dL 162  HDL cholesterol 35 - 70 mg/dL 41  Triglycerides 40 - 160 mg/dL 380(A)  LDL Direct - (None)  LDL Calc - 45  Total protein - (None)  Albumin - (None)   Lab Results  Component Value Date   TSH 2.42 10/25/2014   Lab Results  Component Value Date   BUN 21 10/25/2014   No results found for: HGBA1C     Assessment/Plan  1. Edema Increased furosemide 40 mg daily  2. Anxiety state - LORazepam (ATIVAN) 1 MG tablet; Take 1 tablet (1 mg total) by mouth every 6 (six) hours as needed for anxiety. Take one tablet every 6 hours as needed for anxiety  Dispense: 30 tablet; Refill: 0  3. Itching - hydrocortisone 2.5 % cream; Apply topically 2 (two) times daily. Apply one application topically twice daily  Dispense: 30 g; Refill: 3  4. Skin lesions of back Recommended he see his dermatologist, Dr. Justice Britain in [place].

## 2015-03-23 DIAGNOSIS — Z23 Encounter for immunization: Secondary | ICD-10-CM | POA: Diagnosis not present

## 2015-03-24 DIAGNOSIS — L820 Inflamed seborrheic keratosis: Secondary | ICD-10-CM | POA: Diagnosis not present

## 2015-03-24 DIAGNOSIS — L570 Actinic keratosis: Secondary | ICD-10-CM | POA: Diagnosis not present

## 2015-03-24 DIAGNOSIS — Z85828 Personal history of other malignant neoplasm of skin: Secondary | ICD-10-CM | POA: Diagnosis not present

## 2015-03-24 DIAGNOSIS — Z08 Encounter for follow-up examination after completed treatment for malignant neoplasm: Secondary | ICD-10-CM | POA: Diagnosis not present

## 2015-04-12 ENCOUNTER — Non-Acute Institutional Stay: Payer: Medicare Other | Admitting: Internal Medicine

## 2015-04-12 ENCOUNTER — Other Ambulatory Visit: Payer: Self-pay | Admitting: *Deleted

## 2015-04-12 ENCOUNTER — Encounter: Payer: Self-pay | Admitting: Internal Medicine

## 2015-04-12 VITALS — BP 118/78 | HR 93 | Temp 98.0°F | Resp 20 | Ht 69.0 in | Wt 200.4 lb

## 2015-04-12 DIAGNOSIS — IMO0001 Reserved for inherently not codable concepts without codable children: Secondary | ICD-10-CM

## 2015-04-12 DIAGNOSIS — I872 Venous insufficiency (chronic) (peripheral): Secondary | ICD-10-CM

## 2015-04-12 DIAGNOSIS — L853 Xerosis cutis: Secondary | ICD-10-CM | POA: Insufficient documentation

## 2015-04-12 DIAGNOSIS — I10 Essential (primary) hypertension: Secondary | ICD-10-CM

## 2015-04-12 DIAGNOSIS — R202 Paresthesia of skin: Secondary | ICD-10-CM

## 2015-04-12 DIAGNOSIS — I2510 Atherosclerotic heart disease of native coronary artery without angina pectoris: Secondary | ICD-10-CM | POA: Diagnosis not present

## 2015-04-12 DIAGNOSIS — L989 Disorder of the skin and subcutaneous tissue, unspecified: Secondary | ICD-10-CM

## 2015-04-12 DIAGNOSIS — R609 Edema, unspecified: Secondary | ICD-10-CM

## 2015-04-12 DIAGNOSIS — R030 Elevated blood-pressure reading, without diagnosis of hypertension: Secondary | ICD-10-CM

## 2015-04-12 DIAGNOSIS — F419 Anxiety disorder, unspecified: Secondary | ICD-10-CM

## 2015-04-12 NOTE — Progress Notes (Signed)
Patient ID: Samuel Clayton, male   DOB: 06/20/1926, 81 y.o.   MRN: [id number]    [place] of Service: Clinic (12)     No Known Allergies  Chief Complaint  Patient presents with  . Medical Management of Chronic Issues    BP ?(low on the bottom of the reading) feet tingling alot(meds could be causing)  . Medical Management of Chronic Issues    mild HA,     HPI:  Brother died recently of CHF and Rhythm disturbance. Mother also had CHF.  Dizzy, woozy, loss of strength and energy.   He thinks he was on too much medication.  Using Toprol twice daily until he cut back to 1/2 tablet once daily. Seems to feel better on the lower dose. BP doing OK.  Using Lasix 40 mg daily. Worries about dehydration.  Itching on the back. Saw dermatologist, Dr. Altamese Cabal in [place], who froze off some lesions, but it did not help the itching.  Would like to get established with a local cardiologist.  Medications: Patient's Medications  New Prescriptions   No medications on file  Previous Medications   ASPIRIN 81 MG TABLET    Take 81 mg by mouth daily.   CALCIUM-VITAMIN D (OSCAL WITH D) 500-200 MG-UNIT PER TABLET    Take 1 tablet by mouth.    CARISOPRODOL (SOMA) 350 MG TABLET    Take 350 mg by mouth. Take one tablet daily as needed for muscle spasms   ENALAPRIL (VASOTEC) 10 MG TABLET    One daily to control BP and to strengthen the heart   FUROSEMIDE (LASIX) 40 MG TABLET    One daily to help control edema   HYDROCORTISONE 2.5 % CREAM    Apply topically 2 (two) times daily. Apply one application topically twice daily   LORAZEPAM (ATIVAN) 1 MG TABLET    Take 1 tablet (1 mg total) by mouth every 6 (six) hours as needed for anxiety. Take one tablet every 6 hours as needed for anxiety   METOPROLOL SUCCINATE (TOPROL-XL) 50 MG 24 HR TABLET    Take 50 mg by mouth daily. Take one tablet twice a day for blood pressure   ROSUVASTATIN (CRESTOR) 10 MG TABLET    Take 10 mg by mouth.  Take one tablet daily for crestor   TERBINAFINE (LAMISIL AT) 1 % CREAM    Apply daily to rash on foot  Modified Medications   No medications on file  Discontinued Medications   No medications on file     Review of Systems  Constitutional: Negative for fever, activity change, appetite change, fatigue and unexpected weight change.  HENT: Positive for hearing loss. Negative for congestion, ear pain, rhinorrhea, sore throat, tinnitus, trouble swallowing and voice change.   Eyes:       Corrective lenses  Respiratory: Negative for cough, choking, chest tightness, shortness of breath and wheezing.   Cardiovascular: Negative for chest pain, palpitations and leg swelling.  Gastrointestinal: Positive for constipation. Negative for nausea, abdominal pain, diarrhea and abdominal distention.  Endocrine: Negative for cold intolerance, heat intolerance, polydipsia, polyphagia and polyuria.  Genitourinary: Negative for dysuria, urgency, frequency and testicular pain.       Not incontinent  Musculoskeletal: Positive for back pain. Negative for myalgias, arthralgias, gait problem and neck pain.  Skin: Negative for color change, pallor and rash.       Itching areas of the lower legs. Itching raised lesions of the mid back  and left scapular areas.  Allergic/Immunologic: Negative.   Neurological: Negative.  Negative for dizziness, tremors, syncope, speech difficulty, weakness, numbness and headaches.       Paresthesias of the feet.  Hematological: Negative for adenopathy. Does not bruise/bleed easily.  Psychiatric/Behavioral: Positive for sleep disturbance (difficulty falling asleep). Negative for hallucinations, behavioral problems, confusion and decreased concentration. The patient is not nervous/anxious.     Filed Vitals:   04/12/15 1051  BP: 118/78  Pulse: 93  Temp: 98 F (36.7 C)  TempSrc: Oral  Resp: 20  Height: '5\' 9"'  (1.753 m)  Weight: 200 lb 6.4 oz (90.901 kg)  SpO2: 96%   Body mass  index is 29.58 kg/(m^2).  Physical Exam  Constitutional: He is oriented to person, place, and time. He appears well-developed and well-nourished. No distress.  moderately overweight  HENT:  Right Ear: External ear normal.  Left Ear: External ear normal.  Nose: Nose normal.  Mouth/Throat: Oropharynx is clear and moist. No oropharyngeal exudate.  Bilateral hearing loss  Eyes: Conjunctivae and EOM are normal. Pupils are equal, round, and reactive to light.  Neck: No JVD present. No tracheal deviation present. No thyromegaly present.  Cardiovascular: Normal rate, regular rhythm, normal heart sounds and intact distal pulses.  Exam reveals no gallop and no friction rub.   No murmur heard. Pulmonary/Chest: No respiratory distress. He has no wheezes. He has no rales. He exhibits no tenderness.  Abdominal: He exhibits no distension and no mass. There is no tenderness.  Genitourinary:  hemorrhoid  Musculoskeletal: Normal range of motion. He exhibits no edema or tenderness.  Duypuytren's contracture of the 4th finger bilaterally  Lymphadenopathy:    He has no cervical adenopathy.  Neurological: He is alert and oriented to person, place, and time. He has normal reflexes. No cranial nerve deficit. Coordination normal.  07/30/14 MMSE 26/30. Passed clock drawing.  Skin: No rash noted. No erythema. No pallor.  Itching and burning feet with multiple scaling lesions typical of athlete's foot. Itching lesions of the back. In the mid back area there is a raised area that has a central small thin scab. There is a slight erythematous coloration. At the left scapular area there is another raised lesion, slightly reddish, and with a thin scab-like area on the top of it.  Psychiatric: He has a normal mood and affect. His behavior is normal. Judgment and thought content normal.     Labs reviewed: Lab Summary Latest Ref Rng 10/25/2014  Hemoglobin 13.5 - 17.5 g/dL 13.1(A)  Hematocrit 41 - 53 % 40(A)  White count  - 10.7  Platelet count 150 - 399 K/L 222  Sodium 137 - 147 mmol/L 141  Potassium 3.4 - 5.3 mmol/L 4.6  Calcium - (None)  Phosphorus - (None)  Creatinine 0.6 - 1.3 mg/dL 1.1  AST 14 - 40 U/L 19  Alk Phos 25 - 125 U/L 65  Bilirubin - (None)  Glucose - 98  Cholesterol 0 - 200 mg/dL 162  HDL cholesterol 35 - 70 mg/dL 41  Triglycerides 40 - 160 mg/dL 380(A)  LDL Direct - (None)  LDL Calc - 45  Total protein - (None)  Albumin - (None)   Lab Results  Component Value Date   TSH 2.42 10/25/2014   Lab Results  Component Value Date   BUN 21 10/25/2014   No results found for: HGBA1C     Assessment/Plan  1. Essential hypertension His low blood pressures, I advised him that he did the right thing to  reduce his Toprol to 25 mg twice daily. He may be able to get along without taking enalapril. -Discontinue enalapril  2. Edema, unspecified type I think the main contributor as venous insufficiency. I am not suspicious of congestive heart failure nor right-sided heart failure. He seems to be tolerating the Lasix. -BMP, future  3. Paresthesia Although he blames the paresthesias on his medications, I suspect that this is more of a neuropathy issue.  4. Venous (peripheral) insufficiency Chronic and most likely etiology of the persistent edema of the lower legs and feet  5. Xerosis cutis -Try Vaseline Intensive Care with aloe  6. Anxiety Patient is aware that he has issues with his anxiety  7. Skin lesion of back Removed by dermatologist, Dr. Altamese Cabal in [place]  8. Arteriosclerosis of coronary artery -Referral to cardiology

## 2015-04-13 NOTE — Addendum Note (Signed)
Addended by: Kimber RelicGREEN, [place] G on: 04/13/2015 03:56 PM   Modules accepted: Level of Service

## 2015-05-03 ENCOUNTER — Ambulatory Visit: Payer: Medicare Other | Admitting: Cardiology

## 2015-05-12 ENCOUNTER — Encounter: Payer: Self-pay | Admitting: *Deleted

## 2015-05-12 DIAGNOSIS — R609 Edema, unspecified: Secondary | ICD-10-CM | POA: Diagnosis not present

## 2015-05-12 DIAGNOSIS — I10 Essential (primary) hypertension: Secondary | ICD-10-CM | POA: Diagnosis not present

## 2015-05-12 DIAGNOSIS — L853 Xerosis cutis: Secondary | ICD-10-CM | POA: Diagnosis not present

## 2015-05-12 DIAGNOSIS — E785 Hyperlipidemia, unspecified: Secondary | ICD-10-CM | POA: Diagnosis not present

## 2015-05-12 LAB — CBC AND DIFFERENTIAL
HCT: 42 % (ref 41–53)
Hemoglobin: 14 g/dL (ref 13.5–17.5)
Platelets: 201 10*3/uL (ref 150–399)
WBC: 9.4 10^3/mL

## 2015-05-12 LAB — BASIC METABOLIC PANEL
BUN: 18 mg/dL (ref 4–21)
CREATININE: 1 mg/dL (ref 0.6–1.3)
Glucose: 100 mg/dL
POTASSIUM: 4.3 mmol/L (ref 3.4–5.3)
SODIUM: 142 mmol/L (ref 137–147)

## 2015-05-12 LAB — LIPID PANEL
CHOLESTEROL: 225 mg/dL — AB (ref 0–200)
HDL: 38 mg/dL (ref 35–70)
LDL Cholesterol: 134 mg/dL
TRIGLYCERIDES: 265 mg/dL — AB (ref 40–160)

## 2015-05-12 LAB — HEPATIC FUNCTION PANEL
ALT: 16 U/L (ref 10–40)
AST: 17 U/L (ref 14–40)
Alkaline Phosphatase: 74 U/L (ref 25–125)
Bilirubin, Total: 0.5 mg/dL

## 2015-05-17 ENCOUNTER — Non-Acute Institutional Stay: Payer: Medicare Other | Admitting: Internal Medicine

## 2015-05-17 VITALS — BP 142/84 | HR 86 | Temp 97.5°F | Wt 200.0 lb

## 2015-05-17 DIAGNOSIS — L853 Xerosis cutis: Secondary | ICD-10-CM | POA: Diagnosis not present

## 2015-05-17 DIAGNOSIS — I2510 Atherosclerotic heart disease of native coronary artery without angina pectoris: Secondary | ICD-10-CM

## 2015-05-17 DIAGNOSIS — I10 Essential (primary) hypertension: Secondary | ICD-10-CM

## 2015-05-17 DIAGNOSIS — R202 Paresthesia of skin: Secondary | ICD-10-CM | POA: Diagnosis not present

## 2015-05-17 DIAGNOSIS — I872 Venous insufficiency (chronic) (peripheral): Secondary | ICD-10-CM | POA: Diagnosis not present

## 2015-05-17 DIAGNOSIS — L309 Dermatitis, unspecified: Secondary | ICD-10-CM

## 2015-05-17 DIAGNOSIS — R609 Edema, unspecified: Secondary | ICD-10-CM

## 2015-05-17 DIAGNOSIS — E785 Hyperlipidemia, unspecified: Secondary | ICD-10-CM | POA: Diagnosis not present

## 2015-05-17 MED ORDER — METOPROLOL SUCCINATE ER 50 MG PO TB24: ORAL_TABLET | ORAL | Status: DC

## 2015-05-17 MED ORDER — ROSUVASTATIN CALCIUM 10 MG PO TABS: ORAL_TABLET | ORAL | Status: DC

## 2015-05-17 NOTE — Progress Notes (Signed)
Patient ID: Samuel Clayton, male   DOB: 1925/11/09, 81 y.o.   MRN: [id number]    [place] of Service: Clinic (12)     No Known Allergies  Chief Complaint  Patient presents with  . Medical Management of Chronic Issues    blood pressure, cholesterol, edema, anxiety, itching    HPI:   Essential hypertension - mild elevations in systolic blood pressure about half the time. He stopped enalapril as directed. He also cut the metoprolol to 25 mg twice daily, but when he notices blood pressure rising, he went back to 50 mg twice daily. Blood pressures have come under better control. He is no longer having weak spells like he noticed previously. He feels that reducing the total amount of blood pressure medication has helped him feel better.  Xerosis cutis - continues with diffuse itching  Venous (peripheral) insufficiency - edema has improved on furosemide. There is only a trace amount present bilaterally. He previously tried to wear compression stockings, but felt that he was allergic to the elastic and had breaking out around the distal foreleg.  Edema, unspecified type - improved  Eczema - small blotchy areas of intense pruritus on the hands as well as the feet. Cortisone cream seem to help.  Paresthesia - repair seizures that distally feet.  HLD (hyperlipidemia) - Crestor was discontinued her trying to minimize his medications. LDL is elevated at 134     Medications: Patient's Medications  New Prescriptions   No medications on file  Previous Medications   ASPIRIN 81 MG TABLET    Take 81 mg by mouth daily.   CALCIUM-VITAMIN D (OSCAL WITH D) 500-200 MG-UNIT PER TABLET    Take 1 tablet by mouth.    CARISOPRODOL (SOMA) 350 MG TABLET    Take 350 mg by mouth. Take one tablet daily as needed for muscle spasms   FUROSEMIDE (LASIX) 40 MG TABLET    One daily to help control edema   HYDROCORTISONE 2.5 % CREAM    Apply topically 2 (two) times daily. Apply one  application topically twice daily   LORAZEPAM (ATIVAN) 1 MG TABLET    Take 1 tablet (1 mg total) by mouth every 6 (six) hours as needed for anxiety. Take one tablet every 6 hours as needed for anxiety   METOPROLOL SUCCINATE (TOPROL-XL) 50 MG 24 HR TABLET    Take 50 mg by mouth daily. Take one half tablet twice daily to control blood pressure    TRIAMCINOLONE CREAM (KENALOG) 0.1 %    Use after bathing to itchy skin as needed  Modified Medications   No medications on file  Discontinued Medications   No medications on file     Review of Systems  Constitutional: Negative for fever, activity change, appetite change, fatigue and unexpected weight change.  HENT: Positive for hearing loss. Negative for congestion, ear pain, rhinorrhea, sore throat, tinnitus, trouble swallowing and voice change.   Eyes:       Corrective lenses  Respiratory: Negative for cough, choking, chest tightness, shortness of breath and wheezing.   Cardiovascular: Negative for chest pain, palpitations and leg swelling.  Gastrointestinal: Positive for constipation. Negative for nausea, abdominal pain, diarrhea and abdominal distention.  Endocrine: Negative for cold intolerance, heat intolerance, polydipsia, polyphagia and polyuria.  Genitourinary: Negative for dysuria, urgency, frequency and testicular pain.       Not incontinent  Musculoskeletal: Positive for back pain. Negative for myalgias, arthralgias, gait problem and neck  pain.  Skin: Negative for color change, pallor and rash.       Itching areas of the lower legs. Itching raised lesions of the mid back and left scapular areas.  Allergic/Immunologic: Negative.   Neurological: Negative.  Negative for dizziness, tremors, syncope, speech difficulty, weakness, numbness and headaches.       Paresthesias of the feet.  Hematological: Negative for adenopathy. Does not bruise/bleed easily.  Psychiatric/Behavioral: Positive for sleep disturbance (difficulty falling asleep).  Negative for hallucinations, behavioral problems, confusion and decreased concentration. The patient is not nervous/anxious.     Filed Vitals:   05/17/15 0907  BP: 142/84  Pulse: 86  Temp: 97.5 F (36.4 C)  TempSrc: Oral  Weight: 200 lb (90.719 kg)  SpO2: 93%   Body mass index is 29.52 kg/(m^2).  Physical Exam  Constitutional: He is oriented to person, place, and time. He appears well-developed and well-nourished. No distress.  moderately overweight  HENT:  Right Ear: External ear normal.  Left Ear: External ear normal.  Nose: Nose normal.  Mouth/Throat: Oropharynx is clear and moist. No oropharyngeal exudate.  Bilateral hearing loss  Eyes: Conjunctivae and EOM are normal. Pupils are equal, round, and reactive to light.  Neck: No JVD present. No tracheal deviation present. No thyromegaly present.  Cardiovascular: Normal rate, regular rhythm, normal heart sounds and intact distal pulses.  Exam reveals no gallop and no friction rub.   No murmur heard. Pulmonary/Chest: No respiratory distress. He has no wheezes. He has no rales. He exhibits no tenderness.  Abdominal: He exhibits no distension and no mass. There is no tenderness.  Genitourinary:  hemorrhoid  Musculoskeletal: Normal range of motion. He exhibits no edema or tenderness.  Duypuytren's contracture of the 4th finger bilaterally  Lymphadenopathy:    He has no cervical adenopathy.  Neurological: He is alert and oriented to person, place, and time. He has normal reflexes. No cranial nerve deficit. Coordination normal.  07/30/14 MMSE 26/30. Passed clock drawing.  Skin: No rash noted. No erythema. No pallor.  Itching and burning feet with multiple scaling lesions typical of athlete's foot. Itching lesions of the back. In the mid back area there is a raised area that has a central small thin scab. There is a slight erythematous coloration. At the left scapular area there is another raised lesion, slightly reddish, and with a  thin scab-like area on the top of it.  Psychiatric: He has a normal mood and affect. His behavior is normal. Judgment and thought content normal.     Labs reviewed: Lab Summary Latest Ref Rng 05/12/2015 10/25/2014  Hemoglobin 13.5 - 17.5 g/dL 14.0 13.1(A)  Hematocrit 41 - 53 % 42 40(A)  White count - 9.4 10.7  Platelet count 150 - 399 K/L 201 222  Sodium 137 - 147 mmol/L 142 141  Potassium 3.4 - 5.3 mmol/L 4.3 4.6  Calcium - (None) (None)  Phosphorus - (None) (None)  Creatinine 0.6 - 1.3 mg/dL 1.0 1.1  AST 14 - 40 U/L 17 19  Alk Phos 25 - 125 U/L 74 65  Bilirubin - (None) (None)  Glucose - 100 98  Cholesterol 0 - 200 mg/dL 225(A) 162  HDL cholesterol 35 - 70 mg/dL 38 41  Triglycerides 40 - 160 mg/dL 265(A) 380(A)  LDL Direct - (None) (None)  LDL Calc - 134 45  Total protein - (None) (None)  Albumin - (None) (None)   Lab Results  Component Value Date   TSH 2.42 10/25/2014   Lab Results  Component Value Date   BUN 18 05/12/2015   No results found for: HGBA1C     Assessment/Plan  1. Essential hypertension Continue metoprolol. I do not add additional medication at this time and further watched until his next visit. I did say that Dr. Percival Spanish who will be seeing him soon may choose to add another medication. - metoprolol succinate (TOPROL-XL) 50 MG 24 hr tablet; Take one tablet twice daily to control blood pressure  Dispense: 180 tablet; Refill: 4  2. Xerosis cutis Continue lubricating solutions  3. Venous (peripheral) insufficiency Continue furosemide for now  4. Edema, unspecified type Intended furosemide  5. Eczema Tiny steroid creams as needed  6. Paresthesia Observe  7. HLD (hyperlipidemia) Resume  rosuvastatin (CRESTOR) 10 MG tablet; One daily to lower cholesterol  Dispense: 90 tablet; Refill: 3 - Lipid panel; Future

## 2015-05-23 ENCOUNTER — Encounter: Payer: Self-pay | Admitting: Cardiology

## 2015-05-23 ENCOUNTER — Ambulatory Visit (INDEPENDENT_AMBULATORY_CARE_PROVIDER_SITE_OTHER): Payer: Medicare Other | Admitting: Cardiology

## 2015-05-23 VITALS — BP 140/80 | HR 80 | Ht 70.0 in | Wt 202.1 lb

## 2015-05-23 DIAGNOSIS — I447 Left bundle-branch block, unspecified: Secondary | ICD-10-CM

## 2015-05-23 DIAGNOSIS — R0989 Other specified symptoms and signs involving the circulatory and respiratory systems: Secondary | ICD-10-CM

## 2015-05-23 DIAGNOSIS — I2510 Atherosclerotic heart disease of native coronary artery without angina pectoris: Secondary | ICD-10-CM

## 2015-05-23 NOTE — Patient Instructions (Signed)
Your physician wants you to follow-up in: 1 Year You will receive a reminder letter in the mail two months in advance. If you don't receive a letter, please call our office to schedule the follow-up appointment.  Your physician has requested that you have a carotid duplex. This test is an ultrasound of the carotid arteries in your neck. It looks at blood flow through these arteries that supply the brain with blood. Allow one hour for this exam. There are no restrictions or special instructions.    

## 2015-05-23 NOTE — Progress Notes (Signed)
Cardiology Office Note   Date:  05/23/2015   ID:  Samuel Clayton, DOB September 18, 1925, MRN [id number]  PCP:  Kimber Relic, MD  Cardiologist:   Rollene Rotunda, MD   Chief Complaint  Patient presents with  . Hypertension      History of Present Illness: Samuel Clayton is a 81 y.o. male who presents for evaluation of coronary disease. He is an absolutely delightful gentleman who had bypass surgery in 1998. He hasn't seen a cardiologist in many years. He doesn't report any follow-up cardiovascular testing that he recalls. He tells me that at the time of his diagnosis of coronary disease he had exertional chest pain. However, since bypass he's never had any further similar discomfort. He's very active. He walks daily multiple times to his dining [place] where he lives.  He still goes hunting with his 44 year old friend. With this level of activity he denies any cardiovascular symptoms. The patient denies any new symptoms such as chest discomfort, neck or arm discomfort. There has been no new shortness of breath, PND or orthopnea. There have been no reported palpitations, presyncope or syncope.   Of note he has had some labile blood pressures and has had some change in his blood pressure medicines recently. However, I reviewed a blood pressure diary today and pressure seems to be well controlled recently.  Past Medical History  Diagnosis Date  . Hyperlipemia   . Essential hypertension, malignant   . Back pain   . Anxiety   . Insomnia   . Coronary artery disease   . Coccygodynia 01/31/10  . Keratosis, actinic 08/03/09  . Vitamin D deficiency 05/27/09  . AV block, 1st degree 08/06/07  . Hyperhidrosis, focal, primary 08/06/07  . Dupuytren contracture 08/06/07    4th finger left hand  . Slowing of urinary stream 08/04/07  . Hearing loss 01/16/05  . Gastric ulcer 12/30/01  . Internal thrombosed hemorrhoids 04/30/01  . Cellulitis of lower extremity 01/11/2015  . Diverticulosis 1980    Past Surgical History   Procedure Laterality Date  . Cataract extraction w/ intraocular lens implant Right 1990    Dr. Emmit Pomfret  . Cataract extraction w/ intraocular lens implant Left 1992    Dr. Emmit Pomfret  . Coronary artery bypass graft  1998    Dr Andrey Campanile at [facility]  . Rectal polypectomy  1971    Dr. Elesa Hacker  . Colonoscopy  2003    Diverticulosis     Current Outpatient Prescriptions  Medication Sig Dispense Refill  . aspirin 81 MG tablet Take 81 mg by mouth daily.    . calcium-vitamin D (OSCAL WITH D) 500-200 MG-UNIT per tablet Take 1 tablet by mouth.     . carisoprodol (SOMA) 350 MG tablet Take 350 mg by mouth. Take one tablet daily as needed for muscle spasms    . furosemide (LASIX) 40 MG tablet One daily to help control edema 30 tablet 3  . hydrocortisone 2.5 % cream Apply topically 2 (two) times daily. Apply one application topically twice daily 30 g 3  . LORazepam (ATIVAN) 1 MG tablet Take 1 tablet (1 mg total) by mouth every 6 (six) hours as needed for anxiety. Take one tablet every 6 hours as needed for anxiety 30 tablet 0  . metoprolol succinate (TOPROL-XL) 50 MG 24 hr tablet Take one tablet twice daily to control blood pressure 180 tablet 4  . rosuvastatin (CRESTOR) 10 MG tablet One daily to lower cholesterol 90 tablet 3  .  triamcinolone cream (KENALOG) 0.1 % Use after bathing to itchy skin as needed  0   No current facility-administered medications for this visit.    Allergies:   Orange juice    Social History:  The patient  reports that he quit smoking about 37 years ago. His smoking use included Cigars. He has never used smokeless tobacco. He reports that he drinks alcohol. He reports that he does not use illicit drugs.   Family History:  The patient's family history includes Cerebrovascular Accident in his sister; Diabetes in his sister and sister; Heart disease in his father, mother, and sister; Kidney disease in his sister; Lung disease in his brother; Parkinson's disease in his father.    ROS:   Please see the history of present illness.   Otherwise, review of systems are positive for none.   All other systems are reviewed and negative.    PHYSICAL EXAM: VS:  BP 140/80 mmHg  Pulse 80  Ht 5\' 10"  (1.778 m)  Wt 202 lb 2 oz (91.683 kg)  BMI 29.00 kg/m2 , BMI Body mass index is 29 kg/(m^2). GENERAL:  Well appearing HEENT:  Pupils equal round and reactive, fundi not visualized, oral mucosa unremarkable NECK:  No jugular venous distention, waveform within normal limits, carotid upstroke brisk and symmetric, soft right bruits, no thyromegaly LYMPHATICS:  No cervical, inguinal adenopathy LUNGS:  Clear to auscultation bilaterally BACK:  No CVA tenderness CHEST:  Unremarkable HEART:  PMI not displaced or sustained,S1 and S2 within normal limits, no S3, no S4, no clicks, no rubs, no murmurs, distant heart sounds ABD:  Flat, positive bowel sounds normal in frequency in pitch, no bruits, no rebound, no guarding, no midline pulsatile mass, no hepatomegaly, no splenomegaly EXT:  2 plus pulses throughout, no edema, no cyanosis no clubbing SKIN:  No rashes no nodules NEURO:  Cranial nerves II through XII grossly intact, motor grossly intact throughout PSYCH:  Cognitively intact, oriented to person place and time    EKG:  EKG is ordered today. The ekg ordered today demonstrates sinus rhythm, rate 78, first-degree AV block, left bundle branch block, no old EKGs for comparison.   Recent Labs: 10/25/2014: TSH 2.42 05/12/2015: ALT 16; BUN 18; Creatinine 1.0; Hemoglobin 14.0; Platelets 201; Potassium 4.3; Sodium 142    Lipid Panel    Component Value Date/Time   CHOL 225* 05/12/2015   TRIG 265* 05/12/2015   HDL 38 05/12/2015   LDLCALC 134 05/12/2015      Wt Readings from Last 3 Encounters:  05/23/15 202 lb 2 oz (91.683 kg)  05/17/15 200 lb (90.719 kg)  04/12/15 200 lb 6.4 oz (90.901 kg)      Other studies Reviewed: Additional studies/ records that were reviewed today include: Office  records. Review of the above records demonstrates:  Please see elsewhere in the note.     ASSESSMENT AND PLAN:  CAD:  The patient has absolutely no symptoms. He remains very active. I don't see a role for further cardiovascular testing at this point. He can continue with primary risk reduction.  ABNORMAL EKG:  He has no symptoms related to his conduction disturbance. We will follow this clinically.  BRUIT:  He has possibly a soft carotid bruit and requests carotid Dopplers which I will arrange.   Current medicines are reviewed at length with the patient today.  The patient does not have concerns regarding medicines.  The following changes have been made:  no change  Labs/ tests ordered today include:  Orders Placed This Encounter  Procedures  . EKG 12-Lead     Disposition:   FU with me in one year.    Signed, Rollene Rotunda, MD  05/23/2015 12:49 PM    [facility]

## 2015-05-31 ENCOUNTER — Non-Acute Institutional Stay: Payer: Medicare Other | Admitting: Internal Medicine

## 2015-05-31 ENCOUNTER — Encounter: Payer: Self-pay | Admitting: Internal Medicine

## 2015-05-31 VITALS — BP 120/80 | HR 84 | Temp 97.6°F | Resp 20 | Ht 70.0 in | Wt 200.0 lb

## 2015-05-31 DIAGNOSIS — E785 Hyperlipidemia, unspecified: Secondary | ICD-10-CM | POA: Diagnosis not present

## 2015-05-31 DIAGNOSIS — I2510 Atherosclerotic heart disease of native coronary artery without angina pectoris: Secondary | ICD-10-CM

## 2015-05-31 DIAGNOSIS — R609 Edema, unspecified: Secondary | ICD-10-CM

## 2015-05-31 DIAGNOSIS — L853 Xerosis cutis: Secondary | ICD-10-CM

## 2015-05-31 DIAGNOSIS — I10 Essential (primary) hypertension: Secondary | ICD-10-CM

## 2015-05-31 NOTE — Progress Notes (Signed)
Patient ID: Samuel Clayton, male   DOB: 12-27-1925, 81 y.o.   MRN: [id number]    [facility]     Place of Service: Clinic (12)     Allergies  Allergen Reactions  . Orange Juice [Orange Oil] Other (See Comments)    indegestion    Chief Complaint  Patient presents with  . Acute Visit    Itching on arms, back and ankles, sm red bumps    HPI:  Xerosis cutis - dry skin with generalized itching., Complicated by scattered folliculitis. Has been a problem for 2 years. Does not use any specific skin lotions. Triamcinolone acetonide cream does help.  HLD (hyperlipidemia) - LDL 134 and total cholesterol 225. Values obtained off Crestor. His last prescription was returned as rosuvastatin and he has continued to take the generic version.  Edema, unspecified type - resolved  Essential hypertension - controlled  Arteriosclerosis of coronary artery - saw Dr. Percival Spanish, cardiologist, recently. Was registered as a new patient. There were no recommendations for changes.    Medications: Patient's Medications  New Prescriptions   No medications on file  Previous Medications   ASPIRIN 81 MG TABLET    Take 81 mg by mouth daily.   CALCIUM-VITAMIN D (OSCAL WITH D) 500-200 MG-UNIT PER TABLET    Take 1 tablet by mouth.    CARISOPRODOL (SOMA) 350 MG TABLET    Take 350 mg by mouth. Take one tablet daily as needed for muscle spasms   FUROSEMIDE (LASIX) 40 MG TABLET    One daily to help control edema   HYDROCORTISONE 2.5 % CREAM    Apply topically 2 (two) times daily. Apply one application topically twice daily   LORAZEPAM (ATIVAN) 1 MG TABLET    Take 1 tablet (1 mg total) by mouth every 6 (six) hours as needed for anxiety. Take one tablet every 6 hours as needed for anxiety   METOPROLOL SUCCINATE (TOPROL-XL) 50 MG 24 HR TABLET    Take one tablet twice daily to control blood pressure   ROSUVASTATIN (CRESTOR) 10 MG TABLET    One daily to lower cholesterol   TRIAMCINOLONE CREAM (KENALOG) 0.1  %    Use after bathing to itchy skin as needed  Modified Medications   No medications on file  Discontinued Medications   No medications on file     Review of Systems  Constitutional: Negative for fever, activity change, appetite change, fatigue and unexpected weight change.  HENT: Positive for hearing loss. Negative for congestion, ear pain, rhinorrhea, sore throat, tinnitus, trouble swallowing and voice change.   Eyes:       Corrective lenses  Respiratory: Negative for cough, choking, chest tightness, shortness of breath and wheezing.   Cardiovascular: Negative for chest pain, palpitations and leg swelling.  Gastrointestinal: Positive for constipation. Negative for nausea, abdominal pain, diarrhea and abdominal distention.  Endocrine: Negative for cold intolerance, heat intolerance, polydipsia, polyphagia and polyuria.  Genitourinary: Negative for dysuria, urgency, frequency and testicular pain.       Not incontinent  Musculoskeletal: Positive for back pain. Negative for myalgias, arthralgias, gait problem and neck pain.  Skin: Negative for color change, pallor and rash.       Itching areas of the lower legs. Itching raised lesions of the chest, mid back, and left scapular areas.  Allergic/Immunologic: Negative.   Neurological: Negative.  Negative for dizziness, tremors, syncope, speech difficulty, weakness, numbness and headaches.       Paresthesias of the feet.  Hematological:  Negative for adenopathy. Does not bruise/bleed easily.  Psychiatric/Behavioral: Positive for sleep disturbance (difficulty falling asleep). Negative for hallucinations, behavioral problems, confusion and decreased concentration. The patient is not nervous/anxious.     Filed Vitals:   05/31/15 1000  BP: 120/80  Pulse: 84  Temp: 97.6 F (36.4 C)  TempSrc: Oral  Resp: 20  Height: '5\' 10"'  (1.778 m)  Weight: 200 lb (90.719 kg)  SpO2: 92%   Body mass index is 28.7 kg/(m^2).  Physical Exam    Constitutional: He is oriented to person, place, and time. He appears well-developed and well-nourished. No distress.  moderately overweight  HENT:  Right Ear: External ear normal.  Left Ear: External ear normal.  Nose: Nose normal.  Mouth/Throat: Oropharynx is clear and moist. No oropharyngeal exudate.  Bilateral hearing loss  Eyes: Conjunctivae and EOM are normal. Pupils are equal, round, and reactive to light.  Neck: No JVD present. No tracheal deviation present. No thyromegaly present.  Cardiovascular: Normal rate, regular rhythm, normal heart sounds and intact distal pulses.  Exam reveals no gallop and no friction rub.   No murmur heard. Pulmonary/Chest: No respiratory distress. He has no wheezes. He has no rales. He exhibits no tenderness.  Abdominal: He exhibits no distension and no mass. There is no tenderness.  Genitourinary:  hemorrhoid  Musculoskeletal: Normal range of motion. He exhibits no edema or tenderness.  Duypuytren's contracture of the 4th finger bilaterally  Lymphadenopathy:    He has no cervical adenopathy.  Neurological: He is alert and oriented to person, place, and time. He has normal reflexes. No cranial nerve deficit. Coordination normal.  07/30/14 MMSE 26/30. Passed clock drawing.  Skin: No rash noted. No erythema. No pallor.  Itching and burning feet with multiple scaling lesions typical of athlete's foot. Itching lesions of the back and chest. Patient scratches them and sometimes draws blood. Very dry skin. Scattered folliculitis.  Psychiatric: He has a normal mood and affect. His behavior is normal. Judgment and thought content normal.     Labs reviewed: Lab Summary Latest Ref Rng 05/12/2015 10/25/2014  Hemoglobin 13.5 - 17.5 g/dL 14.0 13.1(A)  Hematocrit 41 - 53 % 42 40(A)  White count - 9.4 10.7  Platelet count 150 - 399 K/L 201 222  Sodium 137 - 147 mmol/L 142 141  Potassium 3.4 - 5.3 mmol/L 4.3 4.6  Calcium - (None) (None)  Phosphorus - (None)  (None)  Creatinine 0.6 - 1.3 mg/dL 1.0 1.1  AST 14 - 40 U/L 17 19  Alk Phos 25 - 125 U/L 74 65  Bilirubin - (None) (None)  Glucose - 100 98  Cholesterol 0 - 200 mg/dL 225(A) 162  HDL cholesterol 35 - 70 mg/dL 38 41  Triglycerides 40 - 160 mg/dL 265(A) 380(A)  LDL Direct - (None) (None)  LDL Calc - 134 45  Total protein - (None) (None)  Albumin - (None) (None)   Lab Results  Component Value Date   TSH 2.42 10/25/2014   Lab Results  Component Value Date   BUN 18 05/12/2015   No results found for: HGBA1C     Assessment/Plan  Xerosis cutis -Apply Vaseline intensive care with a low to antibiotic to help dry skin 1-2 times daily. -Center zinc 10 mg daily to help reduce itching -Triamcinolone acetonide  0.1% cream daily to areas that itch  HLD (hyperlipidemia) - take generic rosuvastatin as recommended  Edema, unspecified type - Discontinue furosemide due to the remote possibility he may be reacting to the  sulfa molecule  Essential hypertension Continue current medications  Arteriosclerosis of coronary artery Continue routine follow-up in 6 months with Dr. Percival Spanish

## 2015-05-31 NOTE — Addendum Note (Signed)
Addended by: GREEN, ARTHUR G on: 05/31/2015 02:42 PM   Modules accepted: Level of Service  

## 2015-06-07 ENCOUNTER — Non-Acute Institutional Stay: Payer: Medicare Other | Admitting: Internal Medicine

## 2015-06-07 VITALS — BP 122/80 | HR 79 | Temp 97.9°F | Resp 20 | Ht 70.0 in | Wt 204.2 lb

## 2015-06-07 DIAGNOSIS — R609 Edema, unspecified: Secondary | ICD-10-CM

## 2015-06-07 DIAGNOSIS — I10 Essential (primary) hypertension: Secondary | ICD-10-CM | POA: Diagnosis not present

## 2015-06-07 DIAGNOSIS — L853 Xerosis cutis: Secondary | ICD-10-CM | POA: Diagnosis not present

## 2015-06-07 DIAGNOSIS — I2510 Atherosclerotic heart disease of native coronary artery without angina pectoris: Secondary | ICD-10-CM

## 2015-06-07 MED ORDER — TORSEMIDE 20 MG PO TABS: ORAL_TABLET | ORAL | Status: DC

## 2015-06-07 NOTE — Progress Notes (Signed)
Patient ID: Samuel Clayton, male   DOB: 08-18-1925, 81 y.o.   MRN: [id number]    [facility]     Place of Service: Clinic (12)     Allergies  Allergen Reactions  . Furosemide Itching  . Orange Juice [Orange Oil] Other (See Comments)    indegestion    Chief Complaint  Patient presents with  . Medical Management of Chronic Issues    F/u for Rash, gained 4 lbs     HPI:  Has improved. Patient states that within 2 hours of beginning Zyrtec, the itching did begin to improve. He has continued to use triamcinolone cream on the lesions but has had no new instance he was last here. He is breathing easily, but the pedal edema has begun to recur and he has added 4 pounds of weight.  I still have some concern that he may have had a reaction to furosemide. I'm going to enter this on his list of allergies.  Medications: Patient's Medications  New Prescriptions   No medications on file  Previous Medications   AMLODIPINE (NORVASC) 5 MG TABLET    Take 5 mg by mouth daily.   ASPIRIN 81 MG TABLET    Take 81 mg by mouth daily.   CARISOPRODOL (SOMA) 350 MG TABLET    Take 350 mg by mouth. Take one tablet daily as needed for muscle spasms   CHOLECALCIFEROL (VITAMIN D-3) 1000 UNITS CAPS    Take 1 capsule by mouth daily.   FOSINOPRIL (MONOPRIL) 40 MG TABLET    Take 40 mg by mouth daily.   LORAZEPAM (ATIVAN) 1 MG TABLET    Take 1 tablet (1 mg total) by mouth every 6 (six) hours as needed for anxiety. Take one tablet every 6 hours as needed for anxiety   METOPROLOL SUCCINATE (TOPROL-XL) 50 MG 24 HR TABLET    Take one tablet twice daily to control blood pressure   ROSUVASTATIN (CRESTOR) 10 MG TABLET    One daily to lower cholesterol   TRIAMCINOLONE CREAM (KENALOG) 0.1 %    Use after bathing to itchy skin as needed  Modified Medications   No medications on file  Discontinued Medications   CALCIUM-VITAMIN D (OSCAL WITH D) 500-200 MG-UNIT PER TABLET    Take 1 tablet by mouth.    FUROSEMIDE  (LASIX) 40 MG TABLET    One daily to help control edema   HYDROCORTISONE 2.5 % CREAM    Apply topically 2 (two) times daily. Apply one application topically twice daily     Review of Systems  Constitutional: Negative for fever, activity change, appetite change, fatigue and unexpected weight change.  HENT: Positive for hearing loss. Negative for congestion, ear pain, rhinorrhea, sore throat, tinnitus, trouble swallowing and voice change.   Eyes:       Corrective lenses  Respiratory: Negative for cough, choking, chest tightness, shortness of breath and wheezing.   Cardiovascular: Positive for leg swelling (1+ bipedal). Negative for chest pain and palpitations.  Gastrointestinal: Positive for constipation. Negative for nausea, abdominal pain, diarrhea and abdominal distention.  Endocrine: Negative for cold intolerance, heat intolerance, polydipsia, polyphagia and polyuria.  Genitourinary: Negative for dysuria, urgency, frequency and testicular pain.       Not incontinent  Musculoskeletal: Positive for back pain. Negative for myalgias, arthralgias, gait problem and neck pain.  Skin: Negative for color change, pallor and rash.       Itching areas of the lower legs. Itching raised lesions of the chest,  mid back, and left scapular areas.  Allergic/Immunologic: Negative.   Neurological: Negative.  Negative for dizziness, tremors, syncope, speech difficulty, weakness, numbness and headaches.       Paresthesias of the feet.  Hematological: Negative for adenopathy. Does not bruise/bleed easily.  Psychiatric/Behavioral: Positive for sleep disturbance (difficulty falling asleep). Negative for hallucinations, behavioral problems, confusion and decreased concentration. The patient is not nervous/anxious.     Filed Vitals:   06/07/15 0856  BP: 122/80  Pulse: 79  Temp: 97.9 F (36.6 C)  TempSrc: Oral  Resp: 20  Height: '5\' 10"'  (1.778 m)  Weight: 204 lb 3.2 oz (92.625 kg)  SpO2: 95%   Body mass  index is 29.3 kg/(m^2).  Physical Exam  Constitutional: He is oriented to person, place, and time. He appears well-developed and well-nourished. No distress.  moderately overweight  HENT:  Right Ear: External ear normal.  Left Ear: External ear normal.  Nose: Nose normal.  Mouth/Throat: Oropharynx is clear and moist. No oropharyngeal exudate.  Bilateral hearing loss  Eyes: Conjunctivae and EOM are normal. Pupils are equal, round, and reactive to light.  Neck: No JVD present. No tracheal deviation present. No thyromegaly present.  Cardiovascular: Normal rate, regular rhythm, normal heart sounds and intact distal pulses.  Exam reveals no gallop and no friction rub.   No murmur heard. Pulmonary/Chest: No respiratory distress. He has no wheezes. He has no rales. He exhibits no tenderness.  Abdominal: He exhibits no distension and no mass. There is no tenderness.  Genitourinary:  hemorrhoid  Musculoskeletal: Normal range of motion. He exhibits edema (1+ bipedal). He exhibits no tenderness.  Duypuytren's contracture of the 4th finger bilaterally  Lymphadenopathy:    He has no cervical adenopathy.  Neurological: He is alert and oriented to person, place, and time. He has normal reflexes. No cranial nerve deficit. Coordination normal.  07/30/14 MMSE 26/30. Passed clock drawing.  Skin: No rash noted. No erythema. No pallor.  Itching and burning feet with multiple scaling lesions typical of athlete's foot. Itching lesions of the back and chest. Patient scratches them and sometimes draws blood. Very dry skin. Scattered folliculitis.  Psychiatric: He has a normal mood and affect. His behavior is normal. Judgment and thought content normal.     Labs reviewed: Lab Summary Latest Ref Rng 05/12/2015 10/25/2014  Hemoglobin 13.5 - 17.5 g/dL 14.0 13.1(A)  Hematocrit 41 - 53 % 42 40(A)  White count - 9.4 10.7  Platelet count 150 - 399 K/L 201 222  Sodium 137 - 147 mmol/L 142 141  Potassium 3.4 - 5.3  mmol/L 4.3 4.6  Calcium - (None) (None)  Phosphorus - (None) (None)  Creatinine 0.6 - 1.3 mg/dL 1.0 1.1  AST 14 - 40 U/L 17 19  Alk Phos 25 - 125 U/L 74 65  Bilirubin - (None) (None)  Glucose - 100 98  Cholesterol 0 - 200 mg/dL 225(A) 162  HDL cholesterol 35 - 70 mg/dL 38 41  Triglycerides 40 - 160 mg/dL 265(A) 380(A)  LDL Direct - (None) (None)  LDL Calc - 134 45  Total protein - (None) (None)  Albumin - (None) (None)   Lab Results  Component Value Date   TSH 2.42 10/25/2014   Lab Results  Component Value Date   BUN 18 05/12/2015   No results found for: HGBA1C     Assessment/Plan  1. Xerosis cutis Continue lubricating lotions  2. Essential hypertension Continue current medication  3. Edema, unspecified type At the index 20 mg  daily is controlled

## 2015-06-09 ENCOUNTER — Ambulatory Visit (HOSPITAL_COMMUNITY)
Admission: RE | Admit: 2015-06-09 | Discharge: 2015-06-09 | Disposition: A | Discharge status: Home / Self Care | Payer: Medicare Other | Source: Ambulatory Visit | Attending: Cardiology | Admitting: Cardiology

## 2015-06-09 DIAGNOSIS — I10 Essential (primary) hypertension: Secondary | ICD-10-CM | POA: Diagnosis not present

## 2015-06-09 DIAGNOSIS — E785 Hyperlipidemia, unspecified: Secondary | ICD-10-CM | POA: Diagnosis not present

## 2015-06-09 DIAGNOSIS — R0989 Other specified symptoms and signs involving the circulatory and respiratory systems: Secondary | ICD-10-CM | POA: Insufficient documentation

## 2015-06-09 DIAGNOSIS — I6523 Occlusion and stenosis of bilateral carotid arteries: Secondary | ICD-10-CM | POA: Insufficient documentation

## 2015-07-14 ENCOUNTER — Telehealth: Payer: Self-pay

## 2015-07-14 NOTE — Telephone Encounter (Signed)
Prior authorization received for Lorazepam  tablet ( take 1 tablet by mouth every 6 hours as needed for anxiety). Prior authorization was initiated by calling [phone]. Patient ID # [id number].  Prior authorization was approved until 07/25/2016. Case number: [id number].

## 2015-08-05 DIAGNOSIS — H524 Presbyopia: Secondary | ICD-10-CM | POA: Diagnosis not present

## 2015-08-05 DIAGNOSIS — H11423 Conjunctival edema, bilateral: Secondary | ICD-10-CM | POA: Diagnosis not present

## 2015-08-05 DIAGNOSIS — H18413 Arcus senilis, bilateral: Secondary | ICD-10-CM | POA: Diagnosis not present

## 2015-08-05 DIAGNOSIS — Z9849 Cataract extraction status, unspecified eye: Secondary | ICD-10-CM | POA: Diagnosis not present

## 2015-08-05 DIAGNOSIS — H52223 Regular astigmatism, bilateral: Secondary | ICD-10-CM | POA: Diagnosis not present

## 2015-08-05 DIAGNOSIS — H5203 Hypermetropia, bilateral: Secondary | ICD-10-CM | POA: Diagnosis not present

## 2015-08-05 DIAGNOSIS — H11153 Pinguecula, bilateral: Secondary | ICD-10-CM | POA: Diagnosis not present

## 2015-08-05 DIAGNOSIS — H40023 Open angle with borderline findings, high risk, bilateral: Secondary | ICD-10-CM | POA: Diagnosis not present

## 2015-08-05 DIAGNOSIS — Z961 Presence of intraocular lens: Secondary | ICD-10-CM | POA: Diagnosis not present

## 2015-08-08 DIAGNOSIS — E785 Hyperlipidemia, unspecified: Secondary | ICD-10-CM | POA: Diagnosis not present

## 2015-08-08 LAB — LIPID PANEL
Cholesterol: 136 mg/dL (ref 0–200)
HDL: 39 mg/dL (ref 35–70)
LDL CALC: 51 mg/dL
Triglycerides: 228 mg/dL — AB (ref 40–160)

## 2015-08-09 ENCOUNTER — Encounter: Payer: Self-pay | Admitting: *Deleted

## 2015-08-16 ENCOUNTER — Non-Acute Institutional Stay: Payer: Medicare HMO | Admitting: Internal Medicine

## 2015-08-16 ENCOUNTER — Encounter: Payer: Self-pay | Admitting: Internal Medicine

## 2015-08-16 VITALS — BP 110/72 | HR 73 | Temp 97.3°F | Resp 20 | Ht 70.0 in | Wt 204.2 lb

## 2015-08-16 DIAGNOSIS — I872 Venous insufficiency (chronic) (peripheral): Secondary | ICD-10-CM

## 2015-08-16 DIAGNOSIS — R609 Edema, unspecified: Secondary | ICD-10-CM

## 2015-08-16 DIAGNOSIS — I10 Essential (primary) hypertension: Secondary | ICD-10-CM

## 2015-08-16 DIAGNOSIS — E785 Hyperlipidemia, unspecified: Secondary | ICD-10-CM

## 2015-08-16 MED ORDER — TORSEMIDE 20 MG PO TABS: ORAL_TABLET | ORAL | Status: DC

## 2015-08-16 NOTE — Progress Notes (Signed)
Patient ID: Samuel Clayton, male   DOB: May 11, 1926, 82 y.o.   MRN: [id number]    [facility]     Place of Service: Clinic (12)     Allergies  Allergen Reactions  . Furosemide Itching  . Orange Juice [Orange Oil] Other (See Comments)    indegestion    Chief Complaint  Patient presents with  . Medical Management of Chronic Issues    lab reviewed    HPI:  Essential hypertension -  Having some dizzy episode Blood pressre slightly low today.  Venous (peripheral) insufficiency -  Contributes to chronic leg edema which ha impoved.  HLD (hyperlipidemia) -  controlled  Edema, unspecified type -  Trace amount    Medications: Patient's Medications  New Prescriptions   No medications on file  Previous Medications   AMLODIPINE (NORVASC) 5 MG TABLET    Take 5 mg by mouth daily.   ASPIRIN 81 MG TABLET    Take 81 mg by mouth daily.   CARISOPRODOL (SOMA) 350 MG TABLET    Take 350 mg by mouth. Reported on 08/16/2015   CHOLECALCIFEROL (VITAMIN D-3) 1000 UNITS CAPS    Take 1 capsule by mouth daily.   FOSINOPRIL (MONOPRIL) 40 MG TABLET    Take 40 mg by mouth daily.   LORAZEPAM (ATIVAN) 1 MG TABLET    Take 1 tablet (1 mg total) by mouth every 6 (six) hours as needed for anxiety. Take one tablet every 6 hours as needed for anxiety   METOPROLOL SUCCINATE (TOPROL-XL) 50 MG 24 HR TABLET    Take one tablet twice daily to control blood pressure   ROSUVASTATIN (CRESTOR) 10 MG TABLET    One daily to lower cholesterol   TORSEMIDE (DEMADEX) 20 MG TABLET    Take one tablet daily to help regulate and prevent edema   TRIAMCINOLONE CREAM (KENALOG) 0.1 %    Use after bathing to itchy skin as needed  Modified Medications   No medications on file  Discontinued Medications   No medications on file     Review of Systems  Constitutional: Negative for fever, activity change, appetite change, fatigue and unexpected weight change.  HENT: Positive for hearing loss. Negative for congestion, ear  pain, rhinorrhea, sore throat, tinnitus, trouble swallowing and voice change.   Eyes:       Corrective lenses  Respiratory: Negative for cough, choking, chest tightness, shortness of breath and wheezing.   Cardiovascular: Positive for leg swelling (1+ bipedal). Negative for chest pain and palpitations.  Gastrointestinal: Positive for constipation. Negative for nausea, abdominal pain, diarrhea and abdominal distention.  Endocrine: Negative for cold intolerance, heat intolerance, polydipsia, polyphagia and polyuria.  Genitourinary: Negative for dysuria, urgency, frequency and testicular pain.       Not incontinent  Musculoskeletal: Positive for back pain. Negative for myalgias, arthralgias, gait problem and neck pain.  Skin: Negative for color change, pallor and rash.       Itching areas of the lower legs. Itching raised lesions of the chest, mid back, and left scapular areas.  Allergic/Immunologic: Negative.   Neurological: Negative.  Negative for dizziness, tremors, syncope, speech difficulty, weakness, numbness and headaches.       Paresthesias of the feet.  Hematological: Negative for adenopathy. Does not bruise/bleed easily.  Psychiatric/Behavioral: Positive for sleep disturbance (difficulty falling asleep). Negative for hallucinations, behavioral problems, confusion and decreased concentration (controlled). The patient is not nervous/anxious.     Filed Vitals:   08/16/15 0931  BP: 110/72  Pulse: 73  Temp: 97.3 F (36.3 C)  TempSrc: Oral  Resp: 20  Height: _0  (1.778 m)  Weight: 204 lb 3.2 oz (92.625 kg)  SpO2: 92%   Wt Readings from Last 3 Encounters:  08/16/15 204 lb 3.2 oz (92.625 kg)  06/07/15 204 lb 3.2 oz (92.625 kg)  05/31/15 200 lb (90.719 kg)    Body mass index is 29.3 kg/(m^2).  Physical Exam  Constitutional: He is oriented to person, place, and time. He appears well-developed and well-nourished. No distress.  moderately overweight  HENT:  Right Ear:  External ear normal.  Left Ear: External ear normal.  Nose: Nose normal.  Mouth/Throat: Oropharynx is clear and moist. No oropharyngeal exudate.  Bilateral hearing loss  Eyes: Conjunctivae and EOM are normal. Pupils are equal, round, and reactive to light.  Neck: No JVD present. No tracheal deviation present. No thyromegaly present.  Cardiovascular: Normal rate, regular rhythm, normal heart sounds and intact distal pulses.  Exam reveals no gallop and no friction rub.   No murmur heard. Pulmonary/Chest: No respiratory distress. He has no wheezes. He has no rales. He exhibits no tenderness.  Abdominal: He exhibits no distension and no mass. There is no tenderness.  Genitourinary:  hemorrhoid  Musculoskeletal: Normal range of motion. He exhibits edema (trace bipedal). He exhibits no tenderness.  Duypuytren's contracture of the 4th finger bilaterally  Lymphadenopathy:    He has no cervical adenopathy.  Neurological: He is alert and oriented to person, place, and time. He has normal reflexes. No cranial nerve deficit. Coordination normal.  07/30/14 MMSE 26/30. Passed clock drawing.  Skin: No rash noted. No erythema. No pallor.  Itching and burning feet with multiple scaling lesions typical of athlete's foot. Itching lesions of the back and chest. Patient scratches them and sometimes draws blood. Very dry skin. Scattered folliculitis.  Psychiatric: He has a normal mood and affect. His behavior is normal. Judgment and thought content normal.     Labs reviewed: Lab Summary Latest Ref Rng 08/08/2015 05/12/2015 10/25/2014  Hemoglobin 13.5 - 17.5 g/dL (None) 14.0 13.1(A)  Hematocrit 41 - 53 % (None) 42 40(A)  White count - (None) 9.4 10.7  Platelet count 150 - 399 K/L (None) 201 222  Sodium 137 - 147 mmol/L (None) 142 141  Potassium 3.4 - 5.3 mmol/L (None) 4.3 4.6  Calcium - (None) (None) (None)  Phosphorus - (None) (None) (None)  Creatinine 0.6 - 1.3 mg/dL (None) 1.0 1.1  AST 14 - 40 U/L  (None) 17 19  Alk Phos 25 - 125 U/L (None) 74 65  Bilirubin - (None) (None) (None)  Glucose - (None) 100 98  Cholesterol 0 - 200 mg/dL 136 225(A) 162  HDL cholesterol 35 - 70 mg/dL 39 38 41  Triglycerides 40 - 160 mg/dL 228(A) 265(A) 380(A)  LDL Direct - (None) (None) (None)  LDL Calc - 51 134 45  Total protein - (None) (None) (None)  Albumin - (None) (None) (None)   Lab Results  Component Value Date   TSH 2.42 10/25/2014   Lab Results  Component Value Date   BUN 18 05/12/2015   BUN 21 10/25/2014   Lab Results  Component Value Date   CREATININE 1.0 05/12/2015   CREATININE 1.1 10/25/2014   No results found for: HGBA1C     Assessment/Plan  1. Essential hypertension  reduce Norvasc to 2.5 mg daly  2. Venous (peripheral) insufficiency unchanged  3. HLD (hyperlipidemia) controlled  4. Edema, unspecified type - torsemide (DEMADEX)  20 MG tablet; Take one tablet daily as needed to help regulate and prevent edema  Dispense: 30 tablet; Refill: 5

## 2015-12-29 DIAGNOSIS — Z85828 Personal history of other malignant neoplasm of skin: Secondary | ICD-10-CM | POA: Diagnosis not present

## 2015-12-29 DIAGNOSIS — Z08 Encounter for follow-up examination after completed treatment for malignant neoplasm: Secondary | ICD-10-CM | POA: Diagnosis not present

## 2015-12-29 DIAGNOSIS — L570 Actinic keratosis: Secondary | ICD-10-CM | POA: Diagnosis not present

## 2016-02-09 ENCOUNTER — Encounter: Payer: Self-pay | Admitting: *Deleted

## 2016-02-09 ENCOUNTER — Encounter: Payer: Self-pay | Admitting: Internal Medicine

## 2016-02-09 DIAGNOSIS — I10 Essential (primary) hypertension: Secondary | ICD-10-CM | POA: Diagnosis not present

## 2016-02-09 DIAGNOSIS — E785 Hyperlipidemia, unspecified: Secondary | ICD-10-CM | POA: Diagnosis not present

## 2016-02-09 DIAGNOSIS — R609 Edema, unspecified: Secondary | ICD-10-CM | POA: Diagnosis not present

## 2016-02-09 LAB — HEPATIC FUNCTION PANEL
ALK PHOS: 69 U/L (ref 25–125)
ALT: 16 U/L (ref 10–40)
AST: 16 U/L (ref 14–40)
BILIRUBIN, TOTAL: 0.4 mg/dL

## 2016-02-09 LAB — BASIC METABOLIC PANEL
BUN: 17 mg/dL (ref 4–21)
Creatinine: 1.1 mg/dL (ref 0.6–1.3)
GLUCOSE: 110 mg/dL
Potassium: 4.7 mmol/L (ref 3.4–5.3)
Sodium: 137 mmol/L (ref 137–147)

## 2016-02-09 LAB — LIPID PANEL
Cholesterol: 138 mg/dL (ref 0–200)
HDL: 46 mg/dL (ref 35–70)
LDL CALC: 57 mg/dL
TRIGLYCERIDES: 176 mg/dL — AB (ref 40–160)

## 2016-02-14 ENCOUNTER — Non-Acute Institutional Stay: Payer: Medicare HMO | Admitting: Internal Medicine

## 2016-02-14 ENCOUNTER — Encounter: Payer: Self-pay | Admitting: Internal Medicine

## 2016-02-14 VITALS — BP 132/74 | HR 74 | Temp 98.2°F | Ht 70.0 in | Wt 208.0 lb

## 2016-02-14 DIAGNOSIS — F419 Anxiety disorder, unspecified: Secondary | ICD-10-CM | POA: Diagnosis not present

## 2016-02-14 DIAGNOSIS — M545 Low back pain, unspecified: Secondary | ICD-10-CM

## 2016-02-14 DIAGNOSIS — I872 Venous insufficiency (chronic) (peripheral): Secondary | ICD-10-CM

## 2016-02-14 DIAGNOSIS — I10 Essential (primary) hypertension: Secondary | ICD-10-CM | POA: Diagnosis not present

## 2016-02-14 DIAGNOSIS — F411 Generalized anxiety disorder: Secondary | ICD-10-CM

## 2016-02-14 DIAGNOSIS — R69 Illness, unspecified: Secondary | ICD-10-CM | POA: Diagnosis not present

## 2016-02-14 DIAGNOSIS — E785 Hyperlipidemia, unspecified: Secondary | ICD-10-CM | POA: Diagnosis not present

## 2016-02-14 DIAGNOSIS — R609 Edema, unspecified: Secondary | ICD-10-CM | POA: Diagnosis not present

## 2016-02-14 DIAGNOSIS — L309 Dermatitis, unspecified: Secondary | ICD-10-CM

## 2016-02-14 MED ORDER — TRIAMCINOLONE ACETONIDE 0.1 % EX CREA: TOPICAL_CREAM | CUTANEOUS | 11 refills | Status: DC

## 2016-02-14 MED ORDER — LORAZEPAM 1 MG PO TABS: ORAL_TABLET | ORAL | 3 refills | Status: DC

## 2016-02-14 NOTE — Progress Notes (Signed)
Facility  [facility]    Place of Service: Clinic (12)     Allergies  Allergen Reactions  . Furosemide Itching  . Orange Juice [Orange Oil] Other (See Comments)    indegestion    Chief Complaint  Patient presents with  . Medical Management of Chronic Issues    6 month medication management blood pressure, cholesterol, edema, review lab.    HPI:   Essential hypertension - controlled  Edema, unspecified type - increased because of 11 hour bus ride yesterday to see the eclipse in the [place].  Midline low back pain without sciatica - Intermittent. No discomfort today. Takes pain medication and a muscle relaxer when it flares up.  HLD (hyperlipidemia) - controlled  Venous (peripheral) insufficiency - contributes to pedal and lower leg edema.  Anxiety - mild  Eczema - better with TCA cream    Medications: Patient's Medications  New Prescriptions   No medications on file  Previous Medications   AMLODIPINE (NORVASC) 5 MG TABLET    Take 5 mg by mouth daily. 1/2 tablet daily to control BP   ASPIRIN 81 MG TABLET    Take 81 mg by mouth daily.   CARISOPRODOL (SOMA) 350 MG TABLET    Take 350 mg by mouth. Reported on 08/16/2015   CHOLECALCIFEROL (VITAMIN D-3) 1000 UNITS CAPS    Take 1 capsule by mouth daily.   ENALAPRIL (VASOTEC) 10 MG TABLET    Take one tablet to control blood pressure and strength heart   FOSINOPRIL (MONOPRIL) 40 MG TABLET    Take 40 mg by mouth daily.   LORAZEPAM (ATIVAN) 1 MG TABLET    Take 1 tablet (1 mg total) by mouth every 6 (six) hours as needed for anxiety. Take one tablet every 6 hours as needed for anxiety   METOPROLOL SUCCINATE (TOPROL-XL) 50 MG 24 HR TABLET    Take one tablet twice daily to control blood pressure   ROSUVASTATIN (CRESTOR) 10 MG TABLET    One daily to lower cholesterol   TORSEMIDE (DEMADEX) 20 MG TABLET    Take one tablet daily as needed to help regulate and prevent edema   TRIAMCINOLONE CREAM (KENALOG) 0.1 %    Use after bathing  to itchy skin as needed  Modified Medications   No medications on file  Discontinued Medications   No medications on file     Review of Systems  Constitutional: Negative for activity change, appetite change, fatigue, fever and unexpected weight change.  HENT: Positive for hearing loss. Negative for congestion, ear pain, rhinorrhea, sore throat, tinnitus, trouble swallowing and voice change.   Eyes:       Corrective lenses  Respiratory: Negative for cough, choking, chest tightness, shortness of breath and wheezing.        Sometimes has a catch in the left side wwhen he takes a deep breath.  Cardiovascular: Positive for leg swelling (1+ bipedal). Negative for chest pain and palpitations.  Gastrointestinal: Positive for constipation. Negative for abdominal distention, abdominal pain, diarrhea and nausea.  Endocrine: Negative for cold intolerance, heat intolerance, polydipsia, polyphagia and polyuria.  Genitourinary: Negative for dysuria, frequency, testicular pain and urgency.       Not incontinent  Musculoskeletal: Positive for back pain. Negative for arthralgias, gait problem, myalgias and neck pain.  Skin: Negative for color change, pallor and rash.       Itching areas of the lower legs. Itching raised lesions of the chest, mid back, and left scapular areas.  Allergic/Immunologic: Negative.   Neurological: Negative.  Negative for dizziness, tremors, syncope, speech difficulty, weakness, numbness and headaches.       Paresthesias of the feet.  Hematological: Negative for adenopathy. Does not bruise/bleed easily.  Psychiatric/Behavioral: Positive for sleep disturbance (difficulty falling asleep). Negative for behavioral problems, confusion, decreased concentration (controlled) and hallucinations. The patient is not nervous/anxious.     Vitals:   02/14/16 1116  BP: 132/74  Pulse: 74  Temp: 98.2 F (36.8 C)  TempSrc: Oral  SpO2: 92%  Weight: 208 lb (94.3 kg)  Height: '5\' 10"'  (1.778  m)   Wt Readings from Last 3 Encounters:  02/14/16 208 lb (94.3 kg)  08/16/15 204 lb 3.2 oz (92.6 kg)  06/07/15 204 lb 3.2 oz (92.6 kg)    Body mass index is 29.84 kg/m.  Physical Exam  Constitutional: He is oriented to person, place, and time. He appears well-developed and well-nourished. No distress.  moderately overweight  HENT:  Right Ear: External ear normal.  Left Ear: External ear normal.  Nose: Nose normal.  Mouth/Throat: Oropharynx is clear and moist. No oropharyngeal exudate.  Bilateral hearing loss  Eyes: Conjunctivae and EOM are normal. Pupils are equal, round, and reactive to light.  Neck: No JVD present. No tracheal deviation present. No thyromegaly present.  Cardiovascular: Normal rate, regular rhythm, normal heart sounds and intact distal pulses.  Exam reveals no gallop and no friction rub.   No murmur heard. Pulmonary/Chest: No respiratory distress. He has no wheezes. He has rales (dry and bilateral). He exhibits no tenderness.  Abdominal: He exhibits no distension and no mass. There is no tenderness.  Genitourinary:  Genitourinary Comments: hemorrhoid  Musculoskeletal: Normal range of motion. He exhibits edema (trace bipedal). He exhibits no tenderness.  Duypuytren's contracture of the 4th finger bilaterally  Lymphadenopathy:    He has no cervical adenopathy.  Neurological: He is alert and oriented to person, place, and time. He has normal reflexes. No cranial nerve deficit. Coordination normal.  07/30/14 MMSE 26/30. Passed clock drawing.  Skin: No rash noted. No erythema. No pallor.  Itching and burning feet with multiple scaling lesions typical of athlete's foot. Itching lesions of the back and chest. Patient scratches them and sometimes draws blood. Very dry skin. Scattered folliculitis.  Psychiatric: He has a normal mood and affect. His behavior is normal. Judgment and thought content normal.     Labs reviewed: Lab Summary Latest Ref Rng & Units  02/09/2016 08/08/2015 05/12/2015 10/25/2014  Hemoglobin 13.5 - 17.5 g/dL (None) (None) 14.0 13.1(A)  Hematocrit 41 - 53 % (None) (None) 42 40(A)  White count 10:3/mL (None) (None) 9.4 10.7  Platelet count 150 - 399 K/L (None) (None) 201 222  Sodium 137 - 147 mmol/L 137 (None) 142 141  Potassium 3.4 - 5.3 mmol/L 4.7 (None) 4.3 4.6  Calcium - (None) (None) (None) (None)  Phosphorus - (None) (None) (None) (None)  Creatinine 0.6 - 1.3 mg/dL 1.1 (None) 1.0 1.1  AST 14 - 40 U/L 16 (None) 17 19  Alk Phos 25 - 125 U/L 69 (None) 74 65  Bilirubin - (None) (None) (None) (None)  Glucose mg/dL 110 (None) 100 98  Cholesterol 0 - 200 mg/dL 138 136 225(A) 162  HDL cholesterol 35 - 70 mg/dL 46 39 38 41  Triglycerides 40 - 160 mg/dL 176(A) 228(A) 265(A) 380(A)  LDL Direct - (None) (None) (None) (None)  LDL Calc mg/dL 57 51 134 45  Total protein - (None) (None) (None) (None)  Albumin - (  None) (None) (None) (None)  Some recent data might be hidden   Lab Results  Component Value Date   TSH 2.42 10/25/2014   Lab Results  Component Value Date   BUN 17 02/09/2016   BUN 18 05/12/2015   BUN 21 10/25/2014   Lab Results  Component Value Date   CREATININE 1.1 02/09/2016   CREATININE 1.0 05/12/2015   CREATININE 1.1 10/25/2014   No results found for: HGBA1C     Assessment/Plan  1. Essential hypertension Controlled  2. Edema, unspecified type Suggested to him that he wear compression stockings. He can also use the torsemide that he has been prescribed.  3. Midline low back pain without sciatica Continue current medication  4. HLD (hyperlipidemia) controlled  5. Venous (peripheral) insufficiency Would respond better to compression stockings than diuretics  6. Anxiety Continue lorazepam  7. Eczema Continue TCA cream

## 2016-02-16 ENCOUNTER — Other Ambulatory Visit: Payer: Self-pay

## 2016-02-16 MED ORDER — CARISOPRODOL 350 MG PO TABS: 350.0000 mg | ORAL_TABLET | Freq: Every day | ORAL | 1 refills | Status: DC

## 2016-04-02 DIAGNOSIS — R69 Illness, unspecified: Secondary | ICD-10-CM | POA: Diagnosis not present

## 2016-04-15 ENCOUNTER — Other Ambulatory Visit: Payer: Self-pay | Admitting: Internal Medicine

## 2016-04-15 DIAGNOSIS — I10 Essential (primary) hypertension: Secondary | ICD-10-CM

## 2016-04-18 ENCOUNTER — Other Ambulatory Visit: Payer: Self-pay

## 2016-04-18 DIAGNOSIS — E7849 Other hyperlipidemia: Secondary | ICD-10-CM

## 2016-04-18 DIAGNOSIS — I10 Essential (primary) hypertension: Secondary | ICD-10-CM

## 2016-04-18 NOTE — Addendum Note (Signed)
Addended by: Maurice SmallBEATTY, SHUENEAKA C on: 04/18/2016 11:28 AM   Modules accepted: Orders

## 2016-04-23 ENCOUNTER — Other Ambulatory Visit: Payer: Self-pay | Admitting: Internal Medicine

## 2016-04-23 DIAGNOSIS — E785 Hyperlipidemia, unspecified: Secondary | ICD-10-CM

## 2016-06-08 ENCOUNTER — Ambulatory Visit: Payer: Medicare Other | Admitting: Cardiology

## 2016-06-11 NOTE — Progress Notes (Signed)
Cardiology Office Note   Date:  06/14/2016   ID:  Samuel Clayton, DOB [birth year], MRN [id number]  PCP:  Murray HodgkinsArthur Green, MD  Cardiologist:   Rollene RotundaJames Gitty Osterlund, MD   Chief Complaint  Patient presents with  . Coronary Artery Disease      History of Present Illness: Samuel Clayton is a 82 y.o. male who presents for evaluation of coronary disease. He had bypass surgery in 1998.  I saw him last year.  Since I last saw him he has done well.  The patient denies any new symptoms such as chest discomfort, neck or arm discomfort. There has been no new shortness of breath, PND or orthopnea. There have been no reported palpitations, presyncope or syncope.  He has had some mild left greater than right lower extremity edema and mild itching rash occasionally. He treats the edema with when necessary diuretic. Unfortunately his wife died in September suddenly. He's had 2 nephews and good friend died last 60 days. He still hunting. His hunting partner will be [phone].  Past Medical History:  Diagnosis Date  . Anxiety   . AV block, 1st degree 08/06/07  . Back pain   . Cellulitis of lower extremity 01/11/2015  . Coccygodynia 01/31/10  . Coronary artery disease   . Diverticulosis 1980  . Dupuytren contracture 08/06/07   4th finger left hand  . Essential hypertension, malignant   . Gastric ulcer 12/30/01  . Hearing loss 01/16/05  . Hyperhidrosis, focal, primary 08/06/07  . Hyperlipemia   . Insomnia   . Internal thrombosed hemorrhoids 04/30/01  . Keratosis, actinic 08/03/09  . Slowing of urinary stream 08/04/07  . Vitamin D deficiency 05/27/09    Past Surgical History:  Procedure Laterality Date  . CATARACT EXTRACTION W/ INTRAOCULAR LENS IMPLANT Right 1990   Dr. Emmit PomfretEpes  . CATARACT EXTRACTION W/ INTRAOCULAR LENS IMPLANT Left 1992   Dr. Emmit PomfretEpes  . COLONOSCOPY  2003   Diverticulosis  . CORONARY ARTERY BYPASS GRAFT  1998   Dr Andrey CampanileWilson at [facility]  . RECTAL POLYPECTOMY  1971   Dr. Elesa Hackereaton     Current Outpatient  Prescriptions  Medication Sig Dispense Refill  . aspirin 81 MG tablet Take 81 mg by mouth daily.    . carisoprodol (SOMA) 350 MG tablet Take 1 tablet (350 mg total) by mouth daily. Reported on 08/16/2015 (Patient taking differently: Take 350 mg by mouth 3 times/day as needed-between meals & bedtime. Reported on 08/16/2015) 30 tablet 1  . Cholecalciferol (VITAMIN D-3) 1000 UNITS CAPS Take 1 capsule by mouth daily.    Marland Kitchen. LORazepam (ATIVAN) 1 MG tablet One every 6 hours if needed for anxiety or rest 120 tablet 3  . metoprolol succinate (TOPROL-XL) 50 MG 24 hr tablet Take one tablet twice daily to control blood pressure 180 tablet 4  . rosuvastatin (CRESTOR) 10 MG tablet TAKE 1 TABLET DAILY TO LOWER CHOLESTEROL 90 tablet 2  . torsemide (DEMADEX) 20 MG tablet Take one tablet daily as needed to help regulate and prevent edema 30 tablet 5  . triamcinolone cream (KENALOG) 0.1 % APPLY SPARINGLY TO AREA OF ITCHING ONCE DAILY. 120 g 0   No current facility-administered medications for this visit.     Allergies:   Furosemide and Orange juice [orange oil]    ROS:  Please see the history of present illness.   Otherwise, review of systems are positive for none.   All other systems are reviewed and negative.    PHYSICAL EXAM:  VS:  BP 140/70   Pulse 70   Ht 5\' 10"  (1.778 m)   Wt 201 lb (91.2 kg)   BMI 28.84 kg/m  , BMI Body mass index is 28.84 kg/m. GENERAL:  Well appearing NECK:  No jugular venous distention, waveform within normal limits, carotid upstroke brisk and symmetric, soft right bruits, no thyromegaly LUNGS:  Clear to auscultation bilaterally BACK:  No CVA tenderness CHEST:  Unremarkable HEART:  PMI not displaced or sustained,S1 and S2 within normal limits, no S3, no S4, no clicks, no rubs, no murmurs, distant heart sounds ABD:  Flat, positive bowel sounds normal in frequency in pitch, no bruits, no rebound, no guarding, no midline pulsatile mass, no hepatomegaly, no splenomegaly EXT:  2  plus pulses throughout, left leg swelling , no cyanosis no clubbing   EKG:  EKG is  ordered today. The ekg ordered today demonstrates sinus rhythm, rate 70, first-degree AV block, left bundle branch block.  No change.   Recent Labs: 02/09/2016: ALT 16; BUN 17; Creatinine 1.1; Potassium 4.7; Sodium 137    Lipid Panel    Component Value Date/Time   CHOL 138 02/09/2016   TRIG 176 (A) 02/09/2016   HDL 46 02/09/2016   LDLCALC 57 02/09/2016      Wt Readings from Last 3 Encounters:  06/14/16 201 lb (91.2 kg)  02/14/16 208 lb (94.3 kg)  08/16/15 204 lb 3.2 oz (92.6 kg)      Other studies Reviewed: Additional studies/ records that were reviewed today include: None Review of the above records demonstrates:     ASSESSMENT AND PLAN:  CAD:  The patient has absolutely no symptoms. He remains very active. I don't see a role for further cardiovascular testing at this point. He can continue with primary risk reduction.  ABNORMAL EKG:  This is unchanged.  No further evaluation is indicated.   BRUIT:  He had very mild stenosis last year and no follow up is needed at this time.     Current medicines are reviewed at length with the patient today.  The patient does not have concerns regarding medicines.  The following changes have been made:  no change  Labs/ tests ordered today include:   Orders Placed This Encounter  Procedures  . EKG 12-Lead     Disposition:   FU with me in one year.    Signed, Rollene RotundaJames Leith Szafranski, MD  06/14/2016 1:55 PM    [facility]

## 2016-06-13 ENCOUNTER — Other Ambulatory Visit: Payer: Self-pay | Admitting: Internal Medicine

## 2016-06-13 DIAGNOSIS — L309 Dermatitis, unspecified: Secondary | ICD-10-CM

## 2016-06-14 ENCOUNTER — Encounter: Payer: Self-pay | Admitting: Cardiology

## 2016-06-14 ENCOUNTER — Ambulatory Visit (INDEPENDENT_AMBULATORY_CARE_PROVIDER_SITE_OTHER): Payer: Medicare HMO | Admitting: Cardiology

## 2016-06-14 VITALS — BP 140/70 | HR 70 | Ht 70.0 in | Wt 201.0 lb

## 2016-06-14 DIAGNOSIS — I2510 Atherosclerotic heart disease of native coronary artery without angina pectoris: Secondary | ICD-10-CM | POA: Diagnosis not present

## 2016-06-14 DIAGNOSIS — I10 Essential (primary) hypertension: Secondary | ICD-10-CM

## 2016-06-14 NOTE — Patient Instructions (Signed)
Medication Instructions:  Continue current medications  Labwork: None Ordered  Testing/Procedures: None Ordered  Follow-Up: Your physician wants you to follow-up in: 1 Year. You will receive a reminder letter in the mail two months in advance. If you don't receive a letter, please call our office to schedule the follow-up appointment.   Any Other Special Instructions Will Be Listed Below (If Applicable).        Happy Holiday   If you need a refill on your cardiac medications before your next appointment, please call your pharmacy.

## 2016-08-03 ENCOUNTER — Other Ambulatory Visit: Payer: Self-pay

## 2016-08-03 DIAGNOSIS — E7849 Other hyperlipidemia: Secondary | ICD-10-CM

## 2016-08-03 DIAGNOSIS — I10 Essential (primary) hypertension: Secondary | ICD-10-CM | POA: Diagnosis not present

## 2016-08-03 DIAGNOSIS — E784 Other hyperlipidemia: Secondary | ICD-10-CM | POA: Diagnosis not present

## 2016-08-06 LAB — COMPLETE METABOLIC PANEL WITH GFR
ALT: 13 U/L (ref 9–46)
AST: 15 U/L (ref 10–35)
Albumin: 3.9 g/dL (ref 3.6–5.1)
Alkaline Phosphatase: 66 U/L (ref 40–115)
BILIRUBIN TOTAL: 0.4 mg/dL (ref 0.2–1.2)
BUN: 14 mg/dL (ref 7–25)
CO2: 26 mmol/L (ref 20–31)
Calcium: 9.2 mg/dL (ref 8.6–10.3)
Chloride: 107 mmol/L (ref 98–110)
Creat: 1.04 mg/dL (ref 0.70–1.11)
GFR, EST NON AFRICAN AMERICAN: 64 mL/min (ref >=60)
GFR, Est African American: 74 mL/min (ref >=60)
GLUCOSE: 103 mg/dL — AB (ref 65–99)
Potassium: 4.2 mmol/L (ref 3.5–5.3)
SODIUM: 140 mmol/L (ref 135–146)
TOTAL PROTEIN: 6.3 g/dL (ref 6.1–8.1)

## 2016-08-06 LAB — LIPID PANEL
CHOL/HDL RATIO: 3.4 ratio (ref <5.0)
Cholesterol: 137 mg/dL (ref <200)
HDL: 40 mg/dL — AB (ref >40)
LDL CALC: 47 mg/dL (ref <100)
Triglycerides: 251 mg/dL — ABNORMAL HIGH (ref <150)
VLDL: 50 mg/dL — ABNORMAL HIGH (ref <30)

## 2016-08-07 ENCOUNTER — Other Ambulatory Visit: Payer: Medicare HMO

## 2016-08-09 ENCOUNTER — Other Ambulatory Visit: Payer: Self-pay | Admitting: Internal Medicine

## 2016-08-09 DIAGNOSIS — I10 Essential (primary) hypertension: Secondary | ICD-10-CM

## 2016-08-14 ENCOUNTER — Encounter: Payer: Self-pay | Admitting: Internal Medicine

## 2016-08-14 ENCOUNTER — Non-Acute Institutional Stay: Payer: Medicare HMO | Admitting: Internal Medicine

## 2016-08-14 VITALS — BP 128/72 | HR 75 | Temp 97.5°F | Resp 20 | Ht 70.0 in | Wt 201.5 lb

## 2016-08-14 DIAGNOSIS — L853 Xerosis cutis: Secondary | ICD-10-CM | POA: Diagnosis not present

## 2016-08-14 DIAGNOSIS — E786 Lipoprotein deficiency: Secondary | ICD-10-CM

## 2016-08-14 DIAGNOSIS — I10 Essential (primary) hypertension: Secondary | ICD-10-CM | POA: Diagnosis not present

## 2016-08-14 DIAGNOSIS — R69 Illness, unspecified: Secondary | ICD-10-CM | POA: Diagnosis not present

## 2016-08-14 DIAGNOSIS — F419 Anxiety disorder, unspecified: Secondary | ICD-10-CM

## 2016-08-14 DIAGNOSIS — I872 Venous insufficiency (chronic) (peripheral): Secondary | ICD-10-CM

## 2016-08-14 DIAGNOSIS — E7849 Other hyperlipidemia: Secondary | ICD-10-CM

## 2016-08-14 DIAGNOSIS — R7989 Other specified abnormal findings of blood chemistry: Secondary | ICD-10-CM

## 2016-08-14 DIAGNOSIS — E784 Other hyperlipidemia: Secondary | ICD-10-CM

## 2016-08-14 DIAGNOSIS — R739 Hyperglycemia, unspecified: Secondary | ICD-10-CM | POA: Diagnosis not present

## 2016-08-14 DIAGNOSIS — R609 Edema, unspecified: Secondary | ICD-10-CM

## 2016-08-14 MED ORDER — LORAZEPAM 1 MG PO TABS: ORAL_TABLET | ORAL | 3 refills | Status: DC

## 2016-08-14 MED ORDER — TRIAMCINOLONE ACETONIDE 0.1 % EX CREA: TOPICAL_CREAM | CUTANEOUS | 0 refills | Status: DC

## 2016-08-14 MED ORDER — TORSEMIDE 20 MG PO TABS: ORAL_TABLET | ORAL | 5 refills | Status: DC

## 2016-08-14 MED ORDER — ROSUVASTATIN CALCIUM 10 MG PO TABS
ORAL_TABLET | ORAL | 2 refills | Status: DC
Start: 2016-08-14 — End: 2018-01-27

## 2016-08-14 MED ORDER — METOPROLOL SUCCINATE ER 50 MG PO TB24: ORAL_TABLET | ORAL | 1 refills | Status: DC

## 2016-08-14 NOTE — Addendum Note (Signed)
Addended by: Kimber RelicGREEN, Berna Gitto G on: 08/14/2016 11:32 PM   Modules accepted: Level of Service

## 2016-08-14 NOTE — Progress Notes (Signed)
Facility  [facility]    Place of Service: Clinic (12)     Allergies  Allergen Reactions  . Furosemide Itching  . Orange Juice [Orange Oil] Other (See Comments)    indegestion    Chief Complaint  Patient presents with  . Medical Management of Chronic Issues    HPI:   Anxiety - improved  Edema, unspecified type - trace to 1+ in both lower legs  Essential hypertension - controlled  Hyperglycemia - controlled by diet  Other hyperlipidemia - controlled  Venous (peripheral) insufficiency - mild peripheral edema  Xerosis cutis - itching has improved  Indigestion - using Gas-X which helps. He also thinks it helped the itching    Medications: Patient's Medications  New Prescriptions   No medications on file  Previous Medications   ASPIRIN 81 MG TABLET    Take 81 mg by mouth daily.   CARISOPRODOL (SOMA) 350 MG TABLET    Take 1 tablet (350 mg total) by mouth daily. Reported on 08/16/2015   CHOLECALCIFEROL (VITAMIN D-3) 1000 UNITS CAPS    Take 1 capsule by mouth daily.   LORAZEPAM (ATIVAN) 1 MG TABLET    One every 6 hours if needed for anxiety or rest   TORSEMIDE (DEMADEX) 20 MG TABLET    Take one tablet daily as needed to help regulate and prevent edema   TRIAMCINOLONE CREAM (KENALOG) 0.1 %    APPLY SPARINGLY TO AREA OF ITCHING ONCE DAILY.  Modified Medications   Modified Medication Previous Medication   METOPROLOL SUCCINATE (TOPROL-XL) 50 MG 24 HR TABLET metoprolol succinate (TOPROL-XL) 50 MG 24 hr tablet      TAKE 1 TABLET TWICE A DAY TO CONTROL BLOOD PRESSURE    TAKE 1 TABLET TWICE A DAY TO CONTROL BLOOD PRESSURE   ROSUVASTATIN (CRESTOR) 10 MG TABLET rosuvastatin (CRESTOR) 10 MG tablet      TAKE 1 TABLET DAILY TO LOWER CHOLESTEROL    TAKE 1 TABLET DAILY TO LOWER CHOLESTEROL  Discontinued Medications   No medications on file     Review of Systems  Constitutional: Negative for activity change, appetite change, fatigue, fever and unexpected weight change.  HENT:  Positive for hearing loss. Negative for congestion, ear pain, rhinorrhea, sore throat, tinnitus, trouble swallowing and voice change.   Eyes:       Corrective lenses  Respiratory: Negative for cough, choking, chest tightness, shortness of breath and wheezing.        Sometimes has a catch in the left side wwhen he takes a deep breath.  Cardiovascular: Positive for leg swelling (1+ bipedal). Negative for chest pain and palpitations.  Gastrointestinal: Positive for constipation. Negative for abdominal distention, abdominal pain, diarrhea and nausea.       Indigestion  Endocrine: Negative for cold intolerance, heat intolerance, polydipsia, polyphagia and polyuria.  Genitourinary: Negative for dysuria, frequency, testicular pain and urgency.       Not incontinent  Musculoskeletal: Positive for back pain. Negative for arthralgias, gait problem, myalgias and neck pain.  Skin: Negative for color change, pallor and rash.       Itching areas of the lower legs. Itching raised lesions of the chest, mid back, and left scapular areas.  Allergic/Immunologic: Negative.   Neurological: Negative.  Negative for dizziness, tremors, syncope, speech difficulty, weakness, numbness and headaches.       Paresthesias of the feet.  Hematological: Negative for adenopathy. Does not bruise/bleed easily.  Psychiatric/Behavioral: Positive for sleep disturbance (difficulty falling asleep). Negative for behavioral  problems, confusion, decreased concentration (controlled) and hallucinations. The patient is not nervous/anxious.     Vitals:   08/14/16 0947  BP: 128/72  Pulse: 75  Resp: 20  Temp: 97.5 F (36.4 C)  SpO2: 98%  Weight: 201 lb 8 oz (91.4 kg)  Height: _0  (1.778 m)   Wt Readings from Last 3 Encounters:  08/14/16 201 lb 8 oz (91.4 kg)  06/14/16 201 lb (91.2 kg)  02/14/16 208 lb (94.3 kg)    Body mass index is 28.91 kg/m.  Physical Exam  Constitutional: He is oriented to person, place, and time. He  appears well-developed and well-nourished. No distress.  moderately overweight  HENT:  Right Ear: External ear normal.  Left Ear: External ear normal.  Nose: Nose normal.  Mouth/Throat: Oropharynx is clear and moist. No oropharyngeal exudate.  Bilateral hearing loss  Eyes: Conjunctivae and EOM are normal. Pupils are equal, round, and reactive to light.  Neck: No JVD present. No tracheal deviation present. No thyromegaly present.  Cardiovascular: Normal rate, regular rhythm, normal heart sounds and intact distal pulses.  Exam reveals no gallop and no friction rub.   No murmur heard. Pulmonary/Chest: No respiratory distress. He has no wheezes. He has rales (dry and bilateral). He exhibits no tenderness.  Abdominal: He exhibits no distension and no mass. There is no tenderness.  Genitourinary:  Genitourinary Comments: hemorrhoid  Musculoskeletal: Normal range of motion. He exhibits edema (trace bipedal). He exhibits no tenderness.  Duypuytren's contracture of the 4th finger bilaterally  Lymphadenopathy:    He has no cervical adenopathy.  Neurological: He is alert and oriented to person, place, and time. He has normal reflexes. No cranial nerve deficit. Coordination normal.  07/30/14 MMSE 26/30. Passed clock drawing.  Skin: No rash noted. No erythema. No pallor.  Itching and burning feet with multiple scaling lesions typical of athlete's foot. Itching lesions of the back and chest. Patient scratches them and sometimes draws blood. Very dry skin. Scattered folliculitis.  Psychiatric: He has a normal mood and affect. His behavior is normal. Judgment and thought content normal.     Labs reviewed: Lab Summary Latest Ref Rng & Units 08/03/2016 02/09/2016 08/08/2015 05/12/2015  Hemoglobin 13.5 - 17.5 g/dL (None) (None) (None) 14.0  Hematocrit 41 - 53 % (None) (None) (None) 42  White count 10:3/mL (None) (None) (None) 9.4  Platelet count 150 - 399 K/L (None) (None) (None) 201  Sodium 135 - 146  mmol/L 140 137 (None) 142  Potassium 3.5 - 5.3 mmol/L 4.2 4.7 (None) 4.3  Calcium 8.6 - 10.3 mg/dL 9.2 (None) (None) (None)  Phosphorus - (None) (None) (None) (None)  Creatinine 0.70 - 1.11 mg/dL 1.04 1.1 (None) 1.0  AST 10 - 35 U/L 15 16 (None) 17  Alk Phos 40 - 115 U/L 66 69 (None) 74  Bilirubin 0.2 - 1.2 mg/dL 0.4 (None) (None) (None)  Glucose 65 - 99 mg/dL 103(H) 110 (None) 100  Cholesterol <200 mg/dL 137 138 136 225(A)  HDL cholesterol >40 mg/dL 40(L) 46 39 38  Triglycerides <150 mg/dL 251(H) 176(A) 228(A) 265(A)  LDL Direct - (None) (None) (None) (None)  LDL Calc <100 mg/dL 47 57 51 134  Total protein 6.1 - 8.1 g/dL 6.3 (None) (None) (None)  Albumin 3.6 - 5.1 g/dL 3.9 (None) (None) (None)  Some recent data might be hidden   Lab Results  Component Value Date   TSH 2.42 10/25/2014   Lab Results  Component Value Date   BUN 14 08/03/2016  BUN 17 02/09/2016   BUN 18 05/12/2015   Lab Results  Component Value Date   CREATININE 1.04 08/03/2016   CREATININE 1.1 02/09/2016   CREATININE 1.0 05/12/2015   No results found for: HGBA1C     Assessment/Plan  1. Anxiety - LORazepam (ATIVAN) 1 MG tablet; One every 6 hours if needed for anxiety or rest  Dispense: 120 tablet; Refill: 3  2. Edema, unspecified type - torsemide (DEMADEX) 20 MG tablet; Take one tablet daily as needed to help regulate and prevent edema  Dispense: 30 tablet; Refill: 5  3. Essential hypertension - metoprolol succinate (TOPROL-XL) 50 MG 24 hr tablet; TAKE 1 TABLET TWICE A DAY TO CONTROL BLOOD PRESSURE  Dispense: 180 tablet; Refill: 1 - Comprehensive metabolic panel; Future  4. Cholesterol blood lowered - rosuvastatin (CRESTOR) 10 MG tablet; TAKE 1 TABLET DAILY TO LOWER CHOLESTEROL  Dispense: 90 tablet; Refill: 2 - Lipid panel; Future  5. Hyperglycemia - Hemoglobin A1c; Future - Comprehensive metabolic panel; Future - Microalbumin, urine; Future  6. Other hyperlipidemia controlled  7. Venous  (peripheral) insufficiency Use compression stockings  8. Xerosis cutis - triamcinolone cream (KENALOG) 0.1 %; APPLY SPARINGLY TO AREA OF ITCHING ONCE DAILY.  Dispense: 120 g; Refill: 0

## 2016-11-06 ENCOUNTER — Encounter: Payer: Self-pay | Admitting: Internal Medicine

## 2016-11-26 ENCOUNTER — Encounter: Payer: Self-pay | Admitting: Internal Medicine

## 2016-12-05 ENCOUNTER — Encounter: Payer: Self-pay | Admitting: Internal Medicine

## 2016-12-18 ENCOUNTER — Other Ambulatory Visit: Payer: Self-pay | Admitting: Nurse Practitioner

## 2016-12-19 NOTE — Addendum Note (Signed)
Addended by: Josph MachoANCE, KIMBERLY A on: 12/19/2016 12:28 PM   Modules accepted: Orders

## 2017-01-03 DIAGNOSIS — L820 Inflamed seborrheic keratosis: Secondary | ICD-10-CM | POA: Diagnosis not present

## 2017-01-03 DIAGNOSIS — Z85828 Personal history of other malignant neoplasm of skin: Secondary | ICD-10-CM | POA: Diagnosis not present

## 2017-01-03 DIAGNOSIS — Z08 Encounter for follow-up examination after completed treatment for malignant neoplasm: Secondary | ICD-10-CM | POA: Diagnosis not present

## 2017-01-03 DIAGNOSIS — L570 Actinic keratosis: Secondary | ICD-10-CM | POA: Diagnosis not present

## 2017-01-31 DIAGNOSIS — E784 Other hyperlipidemia: Secondary | ICD-10-CM | POA: Diagnosis not present

## 2017-01-31 DIAGNOSIS — R739 Hyperglycemia, unspecified: Secondary | ICD-10-CM | POA: Diagnosis not present

## 2017-01-31 DIAGNOSIS — I10 Essential (primary) hypertension: Secondary | ICD-10-CM | POA: Diagnosis not present

## 2017-01-31 LAB — LIPID PANEL
Cholesterol: 137 mg/dL (ref <200)
HDL: 42 mg/dL (ref >40)
LDL CALC: 52 mg/dL (ref <100)
TRIGLYCERIDES: 215 mg/dL — AB (ref <150)
Total CHOL/HDL Ratio: 3.3 Ratio (ref <5.0)
VLDL: 43 mg/dL — ABNORMAL HIGH (ref <30)

## 2017-01-31 LAB — COMPLETE METABOLIC PANEL WITH GFR
ALT: 16 U/L (ref 9–46)
AST: 16 U/L (ref 10–35)
Albumin: 4 g/dL (ref 3.6–5.1)
Alkaline Phosphatase: 70 U/L (ref 40–115)
BUN: 17 mg/dL (ref 7–25)
CO2: 25 mmol/L (ref 20–32)
CREATININE: 1.01 mg/dL (ref 0.70–1.11)
Calcium: 8.9 mg/dL (ref 8.6–10.3)
Chloride: 107 mmol/L (ref 98–110)
GFR, EST AFRICAN AMERICAN: 76 mL/min (ref >=60)
GFR, Est Non African American: 66 mL/min (ref >=60)
GLUCOSE: 105 mg/dL — AB (ref 65–99)
Potassium: 4.3 mmol/L (ref 3.5–5.3)
Sodium: 141 mmol/L (ref 135–146)
TOTAL PROTEIN: 6.3 g/dL (ref 6.1–8.1)
Total Bilirubin: 0.5 mg/dL (ref 0.2–1.2)

## 2017-02-01 LAB — HEMOGLOBIN A1C
Hgb A1c MFr Bld: 6.1 % — ABNORMAL HIGH (ref <5.7)
MEAN PLASMA GLUCOSE: 128 mg/dL

## 2017-02-01 LAB — MICROALBUMIN, URINE: MICROALB UR: 0.7 mg/dL

## 2017-02-05 ENCOUNTER — Telehealth: Payer: Self-pay | Admitting: *Deleted

## 2017-02-05 ENCOUNTER — Encounter: Payer: Medicare HMO | Admitting: Internal Medicine

## 2017-02-05 NOTE — Telephone Encounter (Signed)
Spoke with patient regarding his medication, he stated that he does not take this medication every day. He stated that he only needs it for his back, and has not had any problems with his back in a while. He has a schedule appointment with Dr. Glade LloydPandey on 02/06/2017@9 :30 am. No refill is needed at this time.

## 2017-02-06 ENCOUNTER — Non-Acute Institutional Stay: Payer: Medicare HMO | Admitting: Internal Medicine

## 2017-02-06 ENCOUNTER — Encounter: Payer: Self-pay | Admitting: Internal Medicine

## 2017-02-06 VITALS — BP 132/72 | HR 71 | Temp 97.7°F | Resp 18 | Ht 70.0 in | Wt 201.8 lb

## 2017-02-06 DIAGNOSIS — L853 Xerosis cutis: Secondary | ICD-10-CM

## 2017-02-06 DIAGNOSIS — F419 Anxiety disorder, unspecified: Secondary | ICD-10-CM

## 2017-02-06 DIAGNOSIS — I10 Essential (primary) hypertension: Secondary | ICD-10-CM | POA: Diagnosis not present

## 2017-02-06 DIAGNOSIS — H9193 Unspecified hearing loss, bilateral: Secondary | ICD-10-CM

## 2017-02-06 DIAGNOSIS — J309 Allergic rhinitis, unspecified: Secondary | ICD-10-CM | POA: Diagnosis not present

## 2017-02-06 DIAGNOSIS — E784 Other hyperlipidemia: Secondary | ICD-10-CM

## 2017-02-06 DIAGNOSIS — M62830 Muscle spasm of back: Secondary | ICD-10-CM

## 2017-02-06 DIAGNOSIS — E7849 Other hyperlipidemia: Secondary | ICD-10-CM

## 2017-02-06 DIAGNOSIS — R7303 Prediabetes: Secondary | ICD-10-CM | POA: Diagnosis not present

## 2017-02-06 DIAGNOSIS — I872 Venous insufficiency (chronic) (peripheral): Secondary | ICD-10-CM

## 2017-02-06 DIAGNOSIS — R69 Illness, unspecified: Secondary | ICD-10-CM | POA: Diagnosis not present

## 2017-02-06 NOTE — Progress Notes (Signed)
Facility  [facility] of Service: Clinic (12)     Allergies  Allergen Reactions  . Furosemide Itching  . Orange Juice [Orange Oil] Other (See Comments)    indegestion    Chief Complaint  Patient presents with  . Medical Management of Chronic Issues    6 month follow up. Has concerns about his blood pressure. He also mentioned that Dr. Chilton Si put him on a cream and he has these bumps and when he scratches them water comes out.  . Medication Refill    No refills needed at this time. clarify on the ativan and enalapril  . Results    Discuss labs with patient    HPI:   83 y/o male pt here for routine follow up visit.  HTN- bp reading elevated at home with his machine. In the clinic reading normal. Denies any symptom with it. Currently on baby aspirin and metoprolol succinate 50 mg bid. Has enalapril in his med list, has not taken it for a year.   Anxiety - improved, takes lorazepam 1 mg daily at bedtime  Allergic rhinitis- stable, on cetirizie 10 mg daily, controlled symptom  PVD- stable swelling, takes torsemide 20 mg daily as needed, has not required it recently  Chronic back pain- stable, has not required to use his carisoprodol  Hyperlipidemia - labs resulted this visit, Triglyceride high. Currently on rosuvastatin 10 mg daily  Vitamin d def- takes vit d 1000 u daily  Xerosis cutis - uses triamcinolone 0.1% cream to affected area daily as needed   Medications: Patient's Medications  New Prescriptions   No medications on file  Previous Medications   ASPIRIN 81 MG TABLET    Take 81 mg by mouth daily.   CARISOPRODOL (SOMA) 350 MG TABLET    Take 350 mg by mouth as needed for muscle spasms.    CETIRIZINE (ZYRTEC) 10 MG TABLET    Take 10 mg by mouth daily.   CHOLECALCIFEROL (VITAMIN D-3) 1000 UNITS CAPS    Take 1 capsule by mouth daily.   LORAZEPAM (ATIVAN) 1 MG TABLET    Take 1 mg by mouth at bedtime.   METOPROLOL SUCCINATE (TOPROL-XL) 50  MG 24 HR TABLET    TAKE 1 TABLET TWICE A DAY TO CONTROL BLOOD PRESSURE   ROSUVASTATIN (CRESTOR) 10 MG TABLET    TAKE 1 TABLET DAILY TO LOWER CHOLESTEROL   TORSEMIDE (DEMADEX) 20 MG TABLET    Take 20 mg by mouth as needed.   TRIAMCINOLONE CREAM (KENALOG) 0.1 %    Apply 1 application topically as needed.  Modified Medications   No medications on file  Discontinued Medications   LORAZEPAM (ATIVAN) 1 MG TABLET    One every 6 hours if needed for anxiety or rest   TORSEMIDE (DEMADEX) 20 MG TABLET    Take one tablet daily as needed to help regulate and prevent edema   TRIAMCINOLONE CREAM (KENALOG) 0.1 %    APPLY SPARINGLY TO AREA OF ITCHING ONCE DAILY.     Review of Systems  Constitutional: Negative for activity change, appetite change, fatigue, fever and unexpected weight change.  HENT: Positive for hearing loss, rhinorrhea and sinus pressure. Negative for congestion, ear pain, mouth sores, nosebleeds, sinus pain, sore throat, tinnitus, trouble swallowing and voice change.        Cetrizine helps with his rhinorrhea  Eyes: Positive for visual disturbance. Negative for pain and itching.       Corrective  lenses  Respiratory: Negative for cough, choking, chest tightness, shortness of breath and wheezing.        Sometimes has a catch in the left side wwhen he takes a deep breath.  Cardiovascular: Positive for leg swelling (1+ bipedal). Negative for chest pain and palpitations.       Occasional leg edema and takes fluid pill as needed  Gastrointestinal: Positive for constipation. Negative for abdominal pain, blood in stool, diarrhea, nausea and vomiting.       Occasional indigestion. Takes stool softener as needed for constipation,   Endocrine: Negative for cold intolerance, heat intolerance, polydipsia, polyphagia and polyuria.  Genitourinary: Positive for frequency and urgency. Negative for dysuria, flank pain, hematuria and testicular pain.       Wakes up 4 times at night to urinate.     Musculoskeletal: Positive for arthralgias and back pain. Negative for gait problem, myalgias and neck pain.       Controlled pain and needs soma occasionally, last taken several months back. Finger joints painful at times with stiffness. Aleve helps with the pain  Skin: Negative for color change, pallor, rash and wound.       Itching areas of the lower legs. Itching raised lesions of the chest, mid back, and left scapular areas.  Allergic/Immunologic: Positive for environmental allergies.       Claritin helps  Neurological: Positive for dizziness and numbness. Negative for tremors, seizures, syncope, speech difficulty, weakness, light-headedness and headaches.       Dizziness with sudden change of position. Occasional numbness of hands  Hematological: Negative for adenopathy. Bruises/bleeds easily.  Psychiatric/Behavioral: Positive for sleep disturbance (difficulty falling asleep). Negative for behavioral problems, confusion, decreased concentration (controlled) and hallucinations. The patient is nervous/anxious.        Recently lost his wife and misses her a lot. Lorazepam helps him relax. Denies depression but gets tearful.     Vitals:   02/06/17 0922  BP: 132/72  Pulse: 71  Resp: 18  Temp: 97.7 F (36.5 C)  TempSrc: Oral  SpO2: 95%  Weight: 201 lb 12.8 oz (91.5 kg)  Height: 5\' 10"  (1.778 m)   Wt Readings from Last 3 Encounters:  02/06/17 201 lb 12.8 oz (91.5 kg)  08/14/16 201 lb 8 oz (91.4 kg)  06/14/16 201 lb (91.2 kg)    Body mass index is 28.96 kg/m.  Physical Exam  Constitutional: He is oriented to person, place, and time. He appears well-developed and well-nourished. No distress.  moderately overweight  HENT:  Head: Normocephalic and atraumatic.  Right Ear: External ear normal.  Left Ear: External ear normal.  Nose: Nose normal.  Mouth/Throat: Oropharynx is clear and moist. No oropharyngeal exudate.  Bilateral hearing loss  Eyes: Pupils are equal, round, and  reactive to light. Conjunctivae and EOM are normal. Right eye exhibits no discharge. Left eye exhibits no discharge.  Neck: Normal range of motion. Neck supple. No JVD present. No tracheal deviation present. No thyromegaly present.  Cardiovascular: Normal rate, regular rhythm, normal heart sounds and intact distal pulses.  Exam reveals no gallop and no friction rub.   No murmur heard. Pulmonary/Chest: No respiratory distress. He has no wheezes. He has rales (dry and bilateral). He exhibits no tenderness.  Abdominal: He exhibits no distension and no mass. There is no tenderness.  Musculoskeletal: Normal range of motion. He exhibits edema (trace bipedal). He exhibits no tenderness.  1+ pitting leg edema. Duypuytren's contracture of the 4th finger bilaterally  Lymphadenopathy:  He has no cervical adenopathy.  Neurological: He is alert and oriented to person, place, and time. He has normal reflexes. No cranial nerve deficit. Coordination normal.  07/30/14 MMSE 26/30. Passed clock drawing.  Skin: Skin is warm and dry. No rash noted. He is not diaphoretic. No erythema. No pallor.  Clear fluid filled papule on left leg noted, no erythema. Mentions having such fluid filled lesion at back and other leg at times  Psychiatric: He has a normal mood and affect. His behavior is normal. Judgment and thought content normal.     Labs reviewed:  CBC Latest Ref Rng & Units 05/12/2015 10/25/2014  WBC 10:3/mL 9.4 10.7  Hemoglobin 13.5 - 17.5 g/dL 40.914.0 13.1(A)  Hematocrit 41 - 53 % 42 40(A)  Platelets 150 - 399 K/L 201 222   CMP Latest Ref Rng & Units 01/31/2017 08/03/2016 02/09/2016  Glucose 65 - 99 mg/dL 811(B105(H) 147(W103(H) -  BUN 7 - 25 mg/dL 17 14 17   Creatinine 0.70 - 1.11 mg/dL 2.951.01 6.211.04 1.1  Sodium 308135 - 146 mmol/L 141 140 137  Potassium 3.5 - 5.3 mmol/L 4.3 4.2 4.7  Chloride 98 - 110 mmol/L 107 107 -  CO2 20 - 32 mmol/L 25 26 -  Calcium 8.6 - 10.3 mg/dL 8.9 9.2 -  Total Protein 6.1 - 8.1 g/dL 6.3 6.3 -    Total Bilirubin 0.2 - 1.2 mg/dL 0.5 0.4 -  Alkaline Phos 40 - 115 U/L 70 66 69  AST 10 - 35 U/L 16 15 16   ALT 9 - 46 U/L 16 13 16    Lipid Panel     Component Value Date/Time   CHOL 137 01/31/2017 0800   TRIG 215 (H) 01/31/2017 0800   HDL 42 01/31/2017 0800   CHOLHDL 3.3 01/31/2017 0800   VLDL 43 (H) 01/31/2017 0800   LDLCALC 52 01/31/2017 0800   Lab Results  Component Value Date   HGBA1C 6.1 (H) 01/31/2017    Assessment/Plan  1. Essential hypertension Stable on recheck in office, continue metoprolol succinate, monitor BP. Labs reviewed. stable  2. Venous (peripheral) insufficiency Edema noted on exam. Advised to keep legs elevated at rest and apply compression stockings. Torsemide decreased to 10 mg daily and to take 10 mg daily x 5 days and then as needed only for leg edema  3. Bilateral hearing loss, unspecified hearing loss type HOH, does lip reading, does not wear hearing aid  4. Xerosis cutis Keep skin moisturized. To use triamcinolone cream as needed  5. Other hyperlipidemia Lipid panel reviewed. Elevated triglyceride. Continue rosuvastatin  6. Prediabetes Negative for microalbuminuria. a1c 6.1. Diet and exercise as tolerated. Given his age and co-morbidities, a1c goal <7.5  7. Anxiety disorder, unspecified type Stable overall with lorazepam, monitor  8. Back muscle spasm Stable, has not required carisoprodol recently, has been using it only as needed and on it for 15 years. monitor  9. Allergic rhinitis Stable, continue claritin  Copy of labs provided to pt after review  Labs- none  F/u in 6 months annual exam, MMSE   I spent 40 minutes in total face-to-face time with the patient, more than 50% of which was spent in counseling and coordination of care, reviewing test results, reviewing medication and discussing or reviewing the diagnosis with patient.

## 2017-02-07 ENCOUNTER — Encounter: Payer: Medicare HMO | Admitting: Internal Medicine

## 2017-02-22 ENCOUNTER — Telehealth: Payer: Self-pay | Admitting: Internal Medicine

## 2017-02-22 NOTE — Telephone Encounter (Signed)
I called the patient to [facility].  There was no answer and no option to leave a message. VDM (DD)

## 2017-02-26 NOTE — Telephone Encounter (Signed)
I spoke with the patient about scheduling AWV-I.  He declined stating that he's only interested in seeing Dr. Glade LloydPandey and doesn't want to see the nurse. VDM (DD)

## 2017-03-22 DIAGNOSIS — R69 Illness, unspecified: Secondary | ICD-10-CM | POA: Diagnosis not present

## 2017-04-17 ENCOUNTER — Other Ambulatory Visit: Payer: Self-pay | Admitting: Internal Medicine

## 2017-04-18 NOTE — Telephone Encounter (Signed)
Refill request for lorazepam 1 mg tablet. Rx was called in to pharmacy after being checked on [place] database.

## 2017-05-06 ENCOUNTER — Other Ambulatory Visit: Payer: Self-pay | Admitting: Internal Medicine

## 2017-05-06 DIAGNOSIS — I10 Essential (primary) hypertension: Secondary | ICD-10-CM

## 2017-05-06 DIAGNOSIS — R7989 Other specified abnormal findings of blood chemistry: Secondary | ICD-10-CM

## 2017-08-05 ENCOUNTER — Encounter: Payer: Self-pay | Admitting: Internal Medicine

## 2017-10-29 ENCOUNTER — Ambulatory Visit: Payer: Medicare HMO | Admitting: Internal Medicine

## 2017-10-30 ENCOUNTER — Encounter: Payer: Self-pay | Admitting: Internal Medicine

## 2017-10-30 ENCOUNTER — Non-Acute Institutional Stay: Payer: Medicare HMO | Admitting: Internal Medicine

## 2017-10-30 VITALS — BP 126/70 | HR 70 | Temp 97.4°F | Resp 18 | Ht 70.0 in | Wt 200.0 lb

## 2017-10-30 DIAGNOSIS — G8929 Other chronic pain: Secondary | ICD-10-CM

## 2017-10-30 DIAGNOSIS — E7849 Other hyperlipidemia: Secondary | ICD-10-CM

## 2017-10-30 DIAGNOSIS — R7303 Prediabetes: Secondary | ICD-10-CM

## 2017-10-30 DIAGNOSIS — E559 Vitamin D deficiency, unspecified: Secondary | ICD-10-CM

## 2017-10-30 DIAGNOSIS — H9193 Unspecified hearing loss, bilateral: Secondary | ICD-10-CM

## 2017-10-30 DIAGNOSIS — M545 Low back pain, unspecified: Secondary | ICD-10-CM | POA: Insufficient documentation

## 2017-10-30 DIAGNOSIS — I872 Venous insufficiency (chronic) (peripheral): Secondary | ICD-10-CM | POA: Diagnosis not present

## 2017-10-30 DIAGNOSIS — Z7189 Other specified counseling: Secondary | ICD-10-CM

## 2017-10-30 DIAGNOSIS — G4700 Insomnia, unspecified: Secondary | ICD-10-CM

## 2017-10-30 DIAGNOSIS — I2510 Atherosclerotic heart disease of native coronary artery without angina pectoris: Secondary | ICD-10-CM | POA: Diagnosis not present

## 2017-10-30 MED ORDER — SENNA 8.6 MG PO TABS: 1.0000 | ORAL_TABLET | Freq: Every day | ORAL | 0 refills | Status: DC | PRN

## 2017-10-30 NOTE — ACP (Advance Care Planning) (Signed)
  Goals of care discussion  Reviewed goals of care with patient with patient and myself present. Reviewed current documents. Patient has a living will and HCPOA paperwork. copies of living will present in chart. Will need copy of new HCPOA paperwork as patient mentions he has assigned another niece to be his HCPOA. Patient has a DNR form filled out today. MOST form reviewed and filled out today. Patient would like to remain a DNR in absence of pulse or breath. Patient would like hospitalization with limited interventions only. Patient agrees to iv fluids if indicated. Patient would like antibiotic if indicated as well. Patient does not want a feeding tube for nutritional support. Form filled out and reviewed and signed. I have spent time from 9:05 am- 9:25 am reviewing goals of care today. All questions from patient and/or family member have been answered to the best of my knowledge.

## 2017-10-30 NOTE — Progress Notes (Signed)
[facility]  Provider: Blanchie Serve MD   Location:  [place] of Service:  Clinic (12)  PCP: Blanchie Serve, MD Patient Care Team: Blanchie Serve, MD as PCP - General (Internal Medicine) Murvin Donning, MD (Dermatology) Ngetich, Nelda Bucks, NP as Nurse Practitioner (Family Medicine)  Extended Emergency Contact Information Primary Emergency Contact: Hobbs,Cindy Address: [address]          [place], [place] [address] Johnnette Litter of [place] Phone: [phone] Relation: Friend  Code Status: DNR  Goals of Care: Advanced Directive information Advanced Directives 10/30/2017  Does Patient Have a Medical Advance Directive? Yes  Type of Paramedic of [place];Living will  Does patient want to make changes to medical advance directive? Yes (MAU/Ambulatory/Procedural Areas - Information given)  Copy of [place] in Chart? No - copy requested      Chief Complaint  Patient presents with  . Medical Management of Chronic Issues    Routine Visit. No acute concerns at this time.   . Medication Refill    No refills needed at this time    HPI: Patient is a 84 y.o. male seen today for routine visit. He also follows at [facility] clinic. He gets his medication from [place] [facility]  Chronic back pain- overall stable. Occasional muscle spasm.  takes carisoprodol daily as needed and requires it maybe once a month. Aleve helps and rarely requires it, denies radiculopathy  Allergic rhinitis- taking cetirizine 10 mg daily. Occasional sneezing in morning. Symptom free this visit.   Hyperlipidemia- on rosuvastatin 10 mg daily, denies myalgia  PVD- with venous insufficiency, on torsemide 10 mg daily as needed for edema. On kcl supplement. Edema stable.   Atherosclerosis of coronary artery- chest pain free. Taking baby aspirin, metoprolol tartrate 25 mg bid and crestor 10 mg daily.   Hypertension- taking metoprolol tartrate 25  mg bid with baby aspirin daily.   Past Medical History:  Diagnosis Date  . Anxiety   . AV block, 1st degree 08/06/07  . Back pain   . Cellulitis of lower extremity 01/11/2015  . Coccygodynia 01/31/10  . Coronary artery disease   . Diverticulosis 1980  . Dupuytren contracture 08/06/07   4th finger left hand  . Essential hypertension, malignant   . Gastric ulcer 12/30/01  . Hearing loss 01/16/05  . Hyperhidrosis, focal, primary 08/06/07  . Hyperlipemia   . Insomnia   . Internal thrombosed hemorrhoids 04/30/01  . Keratosis, actinic 08/03/09  . Slowing of urinary stream 08/04/07  . Vitamin D deficiency 05/27/09   Past Surgical History:  Procedure Laterality Date  . CATARACT EXTRACTION W/ INTRAOCULAR LENS IMPLANT Right 1990   Dr. Charise Killian  . CATARACT EXTRACTION W/ INTRAOCULAR LENS IMPLANT Left 1992   Dr. Charise Killian  . COLONOSCOPY  2003   Diverticulosis  . CORONARY ARTERY BYPASS GRAFT  1998   Dr Redmond Pulling at [place]  . RECTAL POLYPECTOMY  1971   Dr. Rolin Barry    reports that he quit smoking about 40 years ago. His smoking use included cigars. He has never used smokeless tobacco. He reports that he drinks alcohol. He reports that he does not use drugs. Social History   Socioeconomic History  . Marital status: Married    Spouse name: Not on file  . Number of children: Not on file  . Years of education: Not on file  . Highest education level: Not on file  Occupational History  .  Occupation: retired Holiday representative  Social Needs  . Financial resource strain: Not on file  . Food insecurity:    Worry: Not on file    Inability: Not on file  . Transportation needs:    Medical: Not on file    Non-medical: Not on file  Tobacco Use  . Smoking status: Former Smoker    Types: Cigars    Last attempt to quit: 06/25/1977    Years since quitting: 40.3  . Smokeless tobacco: Never Used  Substance and Sexual Activity  . Alcohol use: Yes    Comment: occasional beer  . Drug use: No  . Sexual activity: Not on file    Lifestyle  . Physical activity:    Days per week: Not on file    Minutes per session: Not on file  . Stress: Not on file  Relationships  . Social connections:    Talks on phone: Not on file    Gets together: Not on file    Attends religious service: Not on file    Active member of club or organization: Not on file    Attends meetings of clubs or organizations: Not on file    Relationship status: Not on file  . Intimate partner violence:    Fear of current or ex partner: Not on file    Emotionally abused: Not on file    Physically abused: Not on file    Forced sexual activity: Not on file  Other Topics Concern  . Not on file  Social History Narrative   Lives at [facility] since 11/19/2014   Retired Engineer, building services.    Married Olegario Shearer   Stopped smoking cigars 1979   Caffeine beverages none   Alcohol occasional beer   Exercise - none   POA, Living Will   1949 [employer] in [place]    No children     Functional Status Survey:    Family History  Problem Relation Age of Onset  . Heart disease Mother   . Heart disease Father   . Parkinson's disease Father   . Diabetes Sister   . Kidney disease Sister   . Heart disease Sister   . Lung disease Brother   . Diabetes Sister   . Cerebrovascular Accident Sister     Health Maintenance  Topic Date Due  . Samul Dada  10/18/1944  . INFLUENZA VACCINE  01/23/2018  . PNA vac Low Risk Adult  Completed    Allergies  Allergen Reactions  . Furosemide Itching  . Orange Juice [Orange Oil] Other (See Comments)    indegestion    Outpatient Encounter Medications as of 10/30/2017  Medication Sig  . aspirin 81 MG tablet Take 81 mg by mouth daily.  . carisoprodol (SOMA) 350 MG tablet Take 350 mg by mouth daily as needed for muscle spasms.  . cetirizine (ZYRTEC) 10 MG tablet Take 10 mg by mouth daily.  . Cholecalciferol (VITAMIN D-3) 1000 UNITS CAPS Take 1 capsule by mouth daily.  Marland Kitchen LORazepam (ATIVAN) 1 MG tablet Take 1 mg by mouth  daily.   . metoprolol tartrate (LOPRESSOR) 50 MG tablet Take 25 mg by mouth 2 (two) times daily.  . potassium chloride (K-DUR) 10 MEQ tablet Take 10 mEq by mouth daily.  Marland Kitchen Propylene Glycol (SYSTANE BALANCE) 0.6 % SOLN Apply 1 drop to eye 3 (three) times daily. Both eyes  . rosuvastatin (CRESTOR) 10 MG tablet TAKE 1 TABLET DAILY TO LOWER CHOLESTEROL  . torsemide (DEMADEX) 20 MG tablet  Take 10 mg by mouth as needed for edema.  . triamcinolone cream (KENALOG) 0.1 % Apply 1 application topically as needed.  . [DISCONTINUED] LORazepam (ATIVAN) 1 MG tablet TAKE 1 TABLET ONCE DAILY IF NEEDED FOR ANXIETY OR SLEEP  . [DISCONTINUED] metoprolol succinate (TOPROL-XL) 50 MG 24 hr tablet TAKE 1 TABLET TWICE A DAY TO CONTROL BLOOD PRESSURE   No facility-administered encounter medications on file as of 10/30/2017.     Review of Systems  Constitutional: Negative for chills and fever.  HENT: Positive for hearing loss. Negative for congestion, rhinorrhea, sinus pressure, sinus pain and trouble swallowing.        Hearing aids present  Eyes: Positive for visual disturbance.  Respiratory: Negative for cough and shortness of breath.   Cardiovascular: Positive for leg swelling. Negative for chest pain and palpitations.       Notices leg swelling towards end of the day, takes torsemide 2-3 times a week   Gastrointestinal: Positive for constipation. Negative for abdominal pain, diarrhea, nausea and vomiting.  Genitourinary: Negative for dysuria.  Musculoskeletal: Positive for back pain. Negative for gait problem.       No fall reported  Skin: Negative for rash.  Neurological: Negative for dizziness, weakness and headaches.  Psychiatric/Behavioral: Negative for behavioral problems, confusion, hallucinations and self-injury.    Vitals:   10/30/17 0855  BP: 126/70  Pulse: 70  Resp: 18  Temp: (!) 97.4 F (36.3 C)  TempSrc: Oral  SpO2: 94%  Weight: 200 lb (90.7 kg)  Height: '5\' 10"'  (1.778 m)   Body mass  index is 28.7 kg/m.   Wt Readings from Last 3 Encounters:  10/30/17 200 lb (90.7 kg)  02/06/17 201 lb 12.8 oz (91.5 kg)  08/14/16 201 lb 8 oz (91.4 kg)   Physical Exam  Constitutional: He is oriented to person, place, and time. He appears well-developed. No distress.  overweight  HENT:  Head: Normocephalic and atraumatic.  Right Ear: External ear normal.  Left Ear: External ear normal.  Nose: Nose normal.  Mouth/Throat: Oropharynx is clear and moist. No oropharyngeal exudate.  Has hearing aids  Eyes: Pupils are equal, round, and reactive to light. Conjunctivae and EOM are normal. Right eye exhibits no discharge. Left eye exhibits no discharge.  Has corrective glasses  Neck: Normal range of motion. Neck supple.  Cardiovascular: Normal rate and regular rhythm.  Pulmonary/Chest: Effort normal and breath sounds normal. He has no wheezes. He has no rales.  Abdominal: Soft. Bowel sounds are normal.  Musculoskeletal: Normal range of motion. He exhibits edema. He exhibits no tenderness.  Neurological: He is alert and oriented to person, place, and time.  Skin: Skin is warm and dry. He is not diaphoretic.  Psychiatric: He has a normal mood and affect. His behavior is normal.    Labs reviewed: Basic Metabolic Panel: Recent Labs    01/31/17 0800  NA 141  K 4.3  CL 107  CO2 25  GLUCOSE 105*  BUN 17  CREATININE 1.01  CALCIUM 8.9   Liver Function Tests: Recent Labs    01/31/17 0800  AST 16  ALT 16  ALKPHOS 70  BILITOT 0.5  PROT 6.3  ALBUMIN 4.0   No results for input(s): LIPASE, AMYLASE in the last 8760 hours. No results for input(s): AMMONIA in the last 8760 hours. CBC: No results for input(s): WBC, NEUTROABS, HGB, HCT, MCV, PLT in the last 8760 hours. Cardiac Enzymes: No results for input(s): CKTOTAL, CKMB, CKMBINDEX, TROPONINI in the last 8760  hours. BNP: Invalid input(s): POCBNP Lab Results  Component Value Date   HGBA1C 6.1 (H) 01/31/2017   Lab Results    Component Value Date   TSH 2.42 10/25/2014   No results found for: VITAMINB12 No results found for: FOLATE No results found for: IRON, TIBC, FERRITIN  Lipid Panel: Recent Labs    01/31/17 0800  CHOL 137  HDL 42  LDLCALC 52  TRIG 215*  CHOLHDL 3.3   Lab Results  Component Value Date   HGBA1C 6.1 (H) 01/31/2017    Procedures since last visit: No results found.  Assessment/Plan  1. Arteriosclerosis of coronary artery Continue metoprolol tartrate and statin - CMP with eGFR([facility]); Future - Lipid Panel; Future - CBC with Differential/Platelets; Future - TSH; Future  2. Venous (peripheral) insufficiency Keep legs elevated at rest, continue ted hose and prn torsemide with prn kcl - CBC with Differential/Platelets; Future  3. Other hyperlipidemia Continue crestor - Lipid Panel; Future  4. Insomnia, unspecified type Ativan at bedtime helps, does not want it changed. Understands risks associated with this.   5. Bilateral hearing loss, unspecified hearing loss type Continue hearing aid and supportive care  6. Prediabetes - TSH; Future - Hemoglobin A1c; Future  7. Chronic midline low back pain without sciatica Continue soma as needed and aleve as needed.  - Vitamin D, 25-hydroxy; Future  8. Vitamin D deficiency Continue vitamin d supplement - Vitamin D, 25-hydroxy; Future  9. Goals of care, counseling/discussion Last ACP Note 08/01/2017 to 10/30/2017       ACP (Advance Care Planning) by Blanchie Serve, MD at 10/30/2017  9:00 AM    Date of Service   Author Author Type Status Note Type File Time  10/30/2017 Addend Blanchie Serve, MD Physician Signed ACP (Advance Care Planning) 10/30/2017              Goals of care discussion  Reviewed goals of care with patient with patient and myself present. Reviewed current documents. Patient has a living will and HCPOA paperwork. copies of living will present in chart. Will need copy of new HCPOA paperwork as patient mentions he  has assigned another niece to be his HCPOA. Patient has a DNR form filled out today. MOST form reviewed and filled out today. Patient would like to remain a DNR in absence of pulse or breath. Patient would like hospitalization with limited interventions only. Patient agrees to iv fluids if indicated. Patient would like antibiotic if indicated as well. Patient does not want a feeding tube for nutritional support. Form filled out and reviewed and signed. I have spent time from 9:05 am- 9:25 am reviewing goals of care today. All questions from patient and/or family member have been answered to the best of my knowledge.                  Labs/tests ordered:   Lab Orders     CMP with eGFR([facility])     Lipid Panel     CBC with Differential/Platelets     TSH     Hemoglobin A1c     Vitamin D, 25-hydroxy   Next appointment: 3-4 months for physical with MMSE, PHQ 9 and EKG  Communication: reviewed care plan with patient     Blanchie Serve, MD Internal Medicine [place], [place] [address] Cell Phone (Monday-Friday 8 am - 5 pm): [phone] On Call: [phone] and follow prompts after 5 pm and on weekends Office Phone: [phone]  Office Fax: [phone]

## 2017-11-14 DIAGNOSIS — E7849 Other hyperlipidemia: Secondary | ICD-10-CM | POA: Diagnosis not present

## 2017-11-14 DIAGNOSIS — G8929 Other chronic pain: Secondary | ICD-10-CM | POA: Diagnosis not present

## 2017-11-14 DIAGNOSIS — R7303 Prediabetes: Secondary | ICD-10-CM

## 2017-11-14 DIAGNOSIS — I2510 Atherosclerotic heart disease of native coronary artery without angina pectoris: Secondary | ICD-10-CM | POA: Diagnosis not present

## 2017-11-14 DIAGNOSIS — E559 Vitamin D deficiency, unspecified: Secondary | ICD-10-CM | POA: Diagnosis not present

## 2017-11-14 DIAGNOSIS — M545 Low back pain: Secondary | ICD-10-CM | POA: Diagnosis not present

## 2017-11-14 DIAGNOSIS — I872 Venous insufficiency (chronic) (peripheral): Secondary | ICD-10-CM | POA: Diagnosis not present

## 2017-11-15 LAB — LIPID PANEL
Cholesterol: 140 mg/dL (ref <200)
HDL: 45 mg/dL (ref >40)
LDL Cholesterol (Calc): 70 mg/dL (calc)
NON-HDL CHOLESTEROL (CALC): 95 mg/dL (ref <130)
TRIGLYCERIDES: 177 mg/dL — AB (ref <150)
Total CHOL/HDL Ratio: 3.1 (calc) (ref <5.0)

## 2017-11-15 LAB — CBC WITH DIFFERENTIAL/PLATELET
BASOS PCT: 0.7 %
Basophils Absolute: 57 cells/uL (ref 0–200)
EOS PCT: 3.3 %
Eosinophils Absolute: 271 cells/uL (ref 15–500)
HCT: 40.8 % (ref 38.5–50.0)
HEMOGLOBIN: 14 g/dL (ref 13.2–17.1)
LYMPHS ABS: 2517 {cells}/uL (ref 850–3900)
MCH: 31.4 pg (ref 27.0–33.0)
MCHC: 34.3 g/dL (ref 32.0–36.0)
MCV: 91.5 fL (ref 80.0–100.0)
MONOS PCT: 10 %
MPV: 11.8 fL (ref 7.5–12.5)
Neutro Abs: 4535 cells/uL (ref 1500–7800)
Neutrophils Relative %: 55.3 %
PLATELETS: 178 10*3/uL (ref 140–400)
RBC: 4.46 10*6/uL (ref 4.20–5.80)
RDW: 11.8 % (ref 11.0–15.0)
TOTAL LYMPHOCYTE: 30.7 %
WBC mixed population: 820 cells/uL (ref 200–950)
WBC: 8.2 10*3/uL (ref 3.8–10.8)

## 2017-11-15 LAB — COMPLETE METABOLIC PANEL WITH GFR
AG RATIO: 1.7 (calc) (ref 1.0–2.5)
ALBUMIN MSPROF: 4.1 g/dL (ref 3.6–5.1)
ALT: 20 U/L (ref 9–46)
AST: 21 U/L (ref 10–35)
Alkaline phosphatase (APISO): 77 U/L (ref 40–115)
BUN: 20 mg/dL (ref 7–25)
CALCIUM: 9.3 mg/dL (ref 8.6–10.3)
CO2: 26 mmol/L (ref 20–32)
Chloride: 107 mmol/L (ref 98–110)
Creat: 1.04 mg/dL (ref 0.70–1.11)
GFR, EST AFRICAN AMERICAN: 73 mL/min/{1.73_m2} (ref >=60)
GFR, EST NON AFRICAN AMERICAN: 63 mL/min/{1.73_m2} (ref >=60)
GLOBULIN: 2.4 g/dL (ref 1.9–3.7)
Glucose, Bld: 97 mg/dL (ref 65–99)
POTASSIUM: 4.4 mmol/L (ref 3.5–5.3)
Sodium: 141 mmol/L (ref 135–146)
TOTAL PROTEIN: 6.5 g/dL (ref 6.1–8.1)
Total Bilirubin: 0.5 mg/dL (ref 0.2–1.2)

## 2017-11-15 LAB — HEMOGLOBIN A1C
EAG (MMOL/L): 7.3 (calc)
HEMOGLOBIN A1C: 6.2 %{Hb} — AB (ref <5.7)
MEAN PLASMA GLUCOSE: 131 (calc)

## 2017-11-15 LAB — TSH: TSH: 3.88 mIU/L (ref 0.40–4.50)

## 2017-11-15 LAB — VITAMIN D 25 HYDROXY (VIT D DEFICIENCY, FRACTURES): Vit D, 25-Hydroxy: 34 ng/mL (ref 30–100)

## 2018-01-27 ENCOUNTER — Non-Acute Institutional Stay: Payer: Medicare HMO | Admitting: Internal Medicine

## 2018-01-27 VITALS — BP 122/62 | HR 83 | Temp 97.4°F | Resp 18 | Ht 70.0 in | Wt 201.4 lb

## 2018-01-27 DIAGNOSIS — M545 Low back pain: Secondary | ICD-10-CM

## 2018-01-27 DIAGNOSIS — G8929 Other chronic pain: Secondary | ICD-10-CM

## 2018-01-27 DIAGNOSIS — Z0000 Encounter for general adult medical examination without abnormal findings: Secondary | ICD-10-CM

## 2018-01-27 DIAGNOSIS — K5909 Other constipation: Secondary | ICD-10-CM | POA: Diagnosis not present

## 2018-01-27 DIAGNOSIS — F419 Anxiety disorder, unspecified: Secondary | ICD-10-CM | POA: Diagnosis not present

## 2018-01-27 DIAGNOSIS — I440 Atrioventricular block, first degree: Secondary | ICD-10-CM | POA: Insufficient documentation

## 2018-01-27 DIAGNOSIS — R7303 Prediabetes: Secondary | ICD-10-CM | POA: Diagnosis not present

## 2018-01-27 DIAGNOSIS — E7849 Other hyperlipidemia: Secondary | ICD-10-CM

## 2018-01-27 DIAGNOSIS — I872 Venous insufficiency (chronic) (peripheral): Secondary | ICD-10-CM

## 2018-01-27 DIAGNOSIS — I2510 Atherosclerotic heart disease of native coronary artery without angina pectoris: Secondary | ICD-10-CM

## 2018-01-27 DIAGNOSIS — R7989 Other specified abnormal findings of blood chemistry: Secondary | ICD-10-CM

## 2018-01-27 DIAGNOSIS — R69 Illness, unspecified: Secondary | ICD-10-CM | POA: Diagnosis not present

## 2018-01-27 DIAGNOSIS — L509 Urticaria, unspecified: Secondary | ICD-10-CM | POA: Insufficient documentation

## 2018-01-27 MED ORDER — ROSUVASTATIN CALCIUM 10 MG PO TABS: ORAL_TABLET | ORAL | 3 refills | Status: DC

## 2018-01-27 MED ORDER — LORAZEPAM 1 MG PO TABS: 1.0000 mg | ORAL_TABLET | Freq: Every day | ORAL | 0 refills | Status: DC

## 2018-01-27 MED ORDER — METOPROLOL TARTRATE 50 MG PO TABS: 75.0000 mg | ORAL_TABLET | Freq: Two times a day (BID) | ORAL | 3 refills | Status: DC

## 2018-01-27 MED ORDER — CETIRIZINE HCL 10 MG PO TABS: 10.0000 mg | ORAL_TABLET | Freq: Every day | ORAL | 3 refills | Status: DC

## 2018-01-27 NOTE — Progress Notes (Signed)
Provider:  Blanchie Serve MD Location: [place] of Service:  Clinic (12)   PCP: Blanchie Serve, MD Patient Care Team: Blanchie Serve, MD as PCP - General (Internal Medicine) Murvin Donning, MD (Dermatology) Ngetich, Nelda Bucks, NP as Nurse Practitioner (Family Medicine)  Extended Emergency Contact Information Primary Emergency Contact: Hobbs,Cindy Address: [place], [place] [address] Johnnette Litter of [place] Phone: [phone] Relation: Friend  Code Status: DNR  Goals of Care: Advanced Directive information Advanced Directives 01/27/2018  Does Patient Have a Medical Advance Directive? Yes  Type of Paramedic of [place];Living will;Out of facility DNR (pink MOST or yellow form)  Does patient want to make changes to medical advance directive? No - Patient declined  Copy of [place] in Chart? Yes  Pre-existing out of facility DNR order (yellow form or pink MOST form) Yellow form placed in chart (order not valid for inpatient use);Pink MOST form placed in chart (order not valid for inpatient use)     Chief Complaint  Patient presents with  . Annual Exam    Patient stated that he has some concerns about his left pinky, no pain. Patient wants discuss Melatonin  . Medication Refill    metoprolol tartrate, lorazepam and crestor (pending)  . PHQ9    3  . MMSE    25/30. Passed the clock drawing     HPI: Patient is a 84 y.o. male seen today for an annual comprehensive examination. He has been at his baseline. He occasionally has trigger finger to left little finger. Denies any pain or swelling with his joints. He ran out of his lorazepam 2 days back and has been taking melatonin without desired effect. He has not required soma and denies any muscle spasm or pain. He has not required torsemide in few weeks now.   Hyperlipidemia- with elevated TG. Takes rosuvastatin10 mg daily. Denies muscle aches/  pain  Urticaria- takes zyrtec 10 mg daily, would like script with [facility]. Controlled symptom  Atherosclerosis of coronary artery- chest pain free.takes baby aspirin, rosuvastatin 10 mg daily and metoprolol tartrate 25 mg bid for now. Has not seen his cardiologist in 3 years.    Chronic back pain- controlled, has not required any pain medication, has not required soma, uses walker for ambulation, no fall reported  PVD- stable swelling. Denies claudication pain, wound or sores to legs/ feet.   Past Medical History:  Diagnosis Date  . Anxiety   . AV block, 1st degree 08/06/07  . Back pain   . Cellulitis of lower extremity 01/11/2015  . Coccygodynia 01/31/10  . Coronary artery disease   . Diverticulosis 1980  . Dupuytren contracture 08/06/07   4th finger left hand  . Essential hypertension, malignant   . Gastric ulcer 12/30/01  . Hearing loss 01/16/05  . Hyperhidrosis, focal, primary 08/06/07  . Hyperlipemia   . Insomnia   . Internal thrombosed hemorrhoids 04/30/01  . Keratosis, actinic 08/03/09  . Slowing of urinary stream 08/04/07  . Vitamin D deficiency 05/27/09   Past Surgical History:  Procedure Laterality Date  . CATARACT EXTRACTION W/ INTRAOCULAR LENS IMPLANT Right 1990   Dr. Charise Killian  . CATARACT EXTRACTION W/ INTRAOCULAR LENS IMPLANT Left 1992   Dr. Charise Killian  . COLONOSCOPY  2003   Diverticulosis  . CORONARY ARTERY BYPASS GRAFT  1998   Dr Redmond Pulling at [facility]  .  RECTAL POLYPECTOMY  1971   Dr. Rolin Barry    reports that he quit smoking about 40 years ago. His smoking use included cigars. He has never used smokeless tobacco. He reports that he drinks alcohol. He reports that he does not use drugs. Social History   Socioeconomic History  . Marital status: Married    Spouse name: Not on file  . Number of children: Not on file  . Years of education: Not on file  . Highest education level: Not on file  Occupational History  . Occupation: retired Holiday representative  Social  Needs  . Financial resource strain: Not on file  . Food insecurity:    Worry: Not on file    Inability: Not on file  . Transportation needs:    Medical: Not on file    Non-medical: Not on file  Tobacco Use  . Smoking status: Former Smoker    Types: Cigars    Last attempt to quit: 06/25/1977    Years since quitting: 40.6  . Smokeless tobacco: Never Used  Substance and Sexual Activity  . Alcohol use: Yes    Comment: occasional beer  . Drug use: No  . Sexual activity: Not on file  Lifestyle  . Physical activity:    Days per week: Not on file    Minutes per session: Not on file  . Stress: Not on file  Relationships  . Social connections:    Talks on phone: Not on file    Gets together: Not on file    Attends religious service: Not on file    Active member of club or organization: Not on file    Attends meetings of clubs or organizations: Not on file    Relationship status: Not on file  Other Topics Concern  . Not on file  Social History Narrative   [facility] since 11/19/2014   Retired Engineer, building services.    Married Olegario Shearer   Stopped smoking cigars 1979   Caffeine beverages none   Alcohol occasional beer   Exercise - none   POA, Living Will   1949 [employer] in [place]    No children    Family History  Problem Relation Age of Onset  . Heart disease Mother   . Heart disease Father   . Parkinson's disease Father   . Diabetes Sister   . Kidney disease Sister   . Heart disease Sister   . Lung disease Brother   . Diabetes Sister   . Cerebrovascular Accident Sister     Pertinent  Health Maintenance Due  Topic Date Due  . INFLUENZA VACCINE  01/23/2018  . PNA vac Low Risk Adult  Completed   Fall Risk  01/27/2018 02/14/2016 08/16/2015 06/07/2015 04/12/2015  Falls in the past year? No No No No No   Depression screen [facility] 2/9 01/27/2018 02/14/2016 02/08/2015  Decreased Interest 0 0 0  Down, Depressed, Hopeless 0 0 0  PHQ - 2 Score 0 0 0  Altered sleeping 3 - -  Tired,  decreased energy 0 - -  Change in appetite 0 - -  Feeling bad or failure about yourself  0 - -  Trouble concentrating 0 - -  Moving slowly or fidgety/restless 0 - -  Suicidal thoughts 0 - -  PHQ-9 Score 3 - -    Functional Status Survey:    Allergies  Allergen Reactions  . Furosemide Itching  . Orange Juice [Orange Oil] Other (See Comments)    indegestion  Allergies as of 01/27/2018      Reactions   Furosemide Itching   Orange Juice [orange Oil] Other (See Comments)   indegestion      Medication List        Accurate as of 01/27/18  2:46 PM. Always use your most recent med list.          aspirin 81 MG tablet Take 81 mg by mouth daily.   cetirizine 10 MG tablet Commonly known as:  ZYRTEC Take 10 mg by mouth daily.   LORazepam 1 MG tablet Commonly known as:  ATIVAN Take 1 mg by mouth daily.   metoprolol tartrate 50 MG tablet Commonly known as:  LOPRESSOR Take 25 mg by mouth 2 (two) times daily.   potassium chloride 10 MEQ tablet Commonly known as:  K-DUR Take 10 mEq by mouth daily as needed (take with torsemide).   rosuvastatin 10 MG tablet Commonly known as:  CRESTOR TAKE 1 TABLET DAILY TO LOWER CHOLESTEROL   senna 8.6 MG Tabs tablet Commonly known as:  SENOKOT Take 1 tablet (8.6 mg total) by mouth daily as needed for mild constipation.   SYSTANE BALANCE 0.6 % Soln Generic drug:  Propylene Glycol Apply 1 drop to eye as needed. Both eyes   torsemide 20 MG tablet Commonly known as:  DEMADEX Take 10 mg by mouth as needed for edema.   triamcinolone cream 0.1 % Commonly known as:  KENALOG Apply 1 application topically as needed.   Vitamin D-3 1000 units Caps Take 1 capsule by mouth daily.       Review of Systems  Constitutional: Negative for appetite change, chills, fatigue and fever.  HENT: Positive for hearing loss. Negative for congestion, mouth sores, nosebleeds, rhinorrhea and trouble swallowing.   Eyes: Positive for visual disturbance.        Has corrective glasses, due for eye exam  Respiratory: Negative for cough, shortness of breath and wheezing.   Cardiovascular: Positive for leg swelling. Negative for chest pain and palpitations.       Stable leg edema with ted hose, requires torsemide 1-2 times a month  Gastrointestinal: Positive for constipation. Negative for abdominal pain, blood in stool, diarrhea and vomiting.       Sennoside helps with constipation  Genitourinary: Negative for dysuria, flank pain and hematuria.  Musculoskeletal: Positive for arthralgias and gait problem. Negative for back pain.  Skin: Negative for rash.  Neurological: Negative for dizziness, numbness and headaches.  Psychiatric/Behavioral: Positive for sleep disturbance. Negative for behavioral problems, confusion, dysphoric mood and hallucinations. The patient is nervous/anxious.     Vitals:   01/27/18 1406  BP: 122/62  Pulse: 83  Resp: 18  Temp: (!) 97.4 F (36.3 C)  TempSrc: Oral  SpO2: 95%  Weight: 201 lb 6.4 oz (91.4 kg)  Height: '5\' 10"'  (1.778 m)   Body mass index is 28.9 kg/m.   Wt Readings from Last 3 Encounters:  01/27/18 201 lb 6.4 oz (91.4 kg)  10/30/17 200 lb (90.7 kg)  02/06/17 201 lb 12.8 oz (91.5 kg)   Physical Exam  Constitutional: He is oriented to person, place, and time. No distress.  Overweight elderly male  HENT:  Head: Normocephalic and atraumatic.  Right Ear: External ear normal.  Left Ear: External ear normal.  Nose: Nose normal.  Mouth/Throat: Oropharynx is clear and moist. No oropharyngeal exudate.  Eyes: Pupils are equal, round, and reactive to light. Conjunctivae and EOM are normal. Right eye exhibits no discharge. Left eye exhibits  no discharge.  Corrective glasses  Neck: Normal range of motion. Neck supple.  Cardiovascular: Normal rate, regular rhythm and normal heart sounds.  Pulmonary/Chest: Effort normal and breath sounds normal. No respiratory distress. He has no wheezes. He has no rales.    Abdominal: Soft. Bowel sounds are normal. He exhibits no mass. There is no tenderness. There is no guarding.  Musculoskeletal: Normal range of motion. He exhibits edema.  Trace leg edema, unsteady gait, uses walker  Lymphadenopathy:    He has no cervical adenopathy.  Neurological: He is alert and oriented to person, place, and time. He exhibits normal muscle tone.  Skin: Skin is warm and dry. He is not diaphoretic.  Psychiatric: He has a normal mood and affect.    Labs reviewed: Basic Metabolic Panel: Recent Labs    01/31/17 0800 11/14/17 0000  NA 141 141  K 4.3 4.4  CL 107 107  CO2 25 26  GLUCOSE 105* 97  BUN 17 20  CREATININE 1.01 1.04  CALCIUM 8.9 9.3   Liver Function Tests: Recent Labs    01/31/17 0800 11/14/17 0000  AST 16 21  ALT 16 20  ALKPHOS 70  --   BILITOT 0.5 0.5  PROT 6.3 6.5  ALBUMIN 4.0  --    No results for input(s): LIPASE, AMYLASE in the last 8760 hours. No results for input(s): AMMONIA in the last 8760 hours. CBC: Recent Labs    11/14/17 0000  WBC 8.2  NEUTROABS 4,535  HGB 14.0  HCT 40.8  MCV 91.5  PLT 178   Cardiac Enzymes: No results for input(s): CKTOTAL, CKMB, CKMBINDEX, TROPONINI in the last 8760 hours. BNP: Invalid input(s): POCBNP Lab Results  Component Value Date   HGBA1C 6.2 (H) 11/14/2017   Lab Results  Component Value Date   TSH 3.88 11/14/2017   No results found for: VITAMINB12 No results found for: FOLATE No results found for: IRON, TIBC, FERRITIN   Lipid Panel     Component Value Date/Time   CHOL 140 11/14/2017 0000   TRIG 177 (H) 11/14/2017 0000   HDL 45 11/14/2017 0000   CHOLHDL 3.1 11/14/2017 0000   VLDL 43 (H) 01/31/2017 0800   LDLCALC 70 11/14/2017 0000    Imaging and Procedures obtained recently: No results found.   01/27/18 EKG- sinus rhythm with first degree AV block, Left bundle branch block   Assessment/Plan  1. Arteriosclerosis of coronary artery Continue metoprolol, rosuvastatin and  aspirin. ekg reviewed - metoprolol tartrate (LOPRESSOR) 50 MG tablet; Take 1.5 tablets (75 mg total) by mouth 2 (two) times daily.  Dispense: 270 tablet; Refill: 3 - CMP with eGFR([facility]); Future - Lipid Panel; Future - CBC (no diff); Future - Hemoglobin A1c; Future  2. Venous (peripheral) insufficiency Continue prn torsemide with prn kcl, bmp stable, leg edema stable, ted stockings - CMP with eGFR([facility]); Future  3. Other hyperlipidemia Continue crestor - cetirizine (ZYRTEC) 10 MG tablet; Take 1 tablet (10 mg total) by mouth daily.  Dispense: 90 tablet; Refill: 3 - rosuvastatin (CRESTOR) 10 MG tablet; TAKE 1 TABLET DAILY TO LOWER CHOLESTEROL  Dispense: 90 tablet; Refill: 3 - CMP with eGFR([facility]); Future - Lipid Panel; Future  4. Anxiety disorder, unspecified type Continue lorazepam - LORazepam (ATIVAN) 1 MG tablet; Take 1 tablet (1 mg total) by mouth daily.  Dispense: 30 tablet; Refill: 0  5. Prediabetes Monitor a1c, encouraged to do brisk walking for at least 30 minutes 4-5 days a week if tolerated - CBC (no diff); Future -  Hemoglobin A1c; Future  6. Chronic midline low back pain without sciatica Encouraged to take tylenol if needed for pain, d/c soma for non use  7. Annual physical exam the patient was counseled regarding the appropriate prevention of dental and periodontal disease, diet, regular sustained exercise for at least 30 minutes 5 times per week, the proper use of sunscreen and protective clothing, and recommended routine cholesterol, thyroid and diabetes screening. uptodate with pneumococcal and influenza vaccine. PHQ 9 and MMSE reviewed.  8. First degree AV block Monitor clinically and interval EKG to assess for second degree AV block, on metoprolol, asymptomatic.   9. Chronic constipation Continue senna, encouraged hydration  10. Urticaria Continue cetirizine - cetirizine (ZYRTEC) 10 MG tablet; Take 1 tablet (10 mg total) by mouth daily.  Dispense: 90 tablet;  Refill: 3 - CBC (no diff); Future    Family/ staff Communication: reviewed care plan with patient , charge nurse  Labs/tests ordered:  Lab Orders     CMP with eGFR([facility])     Lipid Panel     CBC (no diff)     Hemoglobin A1c   Blanchie Serve, MD Internal Medicine [place], [place] [address] Cell Phone (Monday-Friday 8 am - 5 pm): [phone] On Call: [phone] and follow prompts after 5 pm and on weekends Office Phone: [phone] Office Fax: [phone]

## 2018-01-31 ENCOUNTER — Telehealth: Payer: Self-pay | Admitting: *Deleted

## 2018-01-31 NOTE — Telephone Encounter (Signed)
PA completed and faxed to [facility] for lorazepam. Awaiting determination.

## 2018-02-05 ENCOUNTER — Encounter: Payer: Self-pay | Admitting: Internal Medicine

## 2018-02-18 ENCOUNTER — Other Ambulatory Visit: Payer: Self-pay | Admitting: *Deleted

## 2018-02-18 DIAGNOSIS — I2510 Atherosclerotic heart disease of native coronary artery without angina pectoris: Secondary | ICD-10-CM

## 2018-02-18 DIAGNOSIS — I872 Venous insufficiency (chronic) (peripheral): Secondary | ICD-10-CM

## 2018-02-18 DIAGNOSIS — E785 Hyperlipidemia, unspecified: Secondary | ICD-10-CM

## 2018-02-18 DIAGNOSIS — L509 Urticaria, unspecified: Secondary | ICD-10-CM

## 2018-02-18 DIAGNOSIS — R7303 Prediabetes: Secondary | ICD-10-CM

## 2018-03-11 ENCOUNTER — Other Ambulatory Visit: Payer: Self-pay | Admitting: *Deleted

## 2018-03-11 DIAGNOSIS — F419 Anxiety disorder, unspecified: Secondary | ICD-10-CM

## 2018-03-11 MED ORDER — LORAZEPAM 1 MG PO TABS: 1.0000 mg | ORAL_TABLET | Freq: Every day | ORAL | 0 refills | Status: DC

## 2018-03-11 MED ORDER — LORAZEPAM 1 MG PO TABS: 1.0000 mg | ORAL_TABLET | Freq: Every day | ORAL | 1 refills | Status: DC

## 2018-03-11 NOTE — Telephone Encounter (Signed)
Patient stopped by the office with his medication bottle of Lorazepam and stated that he was out and needed a refill sent to [facility]. Patient stated that it was suppose to be a 90 day supply but he only got #30 and now he is completely out. Requesting 90 day supply   NCCSRS Database Verified LR: 01/29/2018 #30 Pended Rx and sent to Dr. Glade Lloydpandey for approval.  Pharmacy Confirmed.

## 2018-03-11 NOTE — Telephone Encounter (Signed)
Patient is completely out of medication and is waiting for his Medication to come from [facility]. #15 called to local pharmacy.

## 2018-03-21 DIAGNOSIS — R69 Illness, unspecified: Secondary | ICD-10-CM | POA: Diagnosis not present

## 2018-05-28 DIAGNOSIS — I2510 Atherosclerotic heart disease of native coronary artery without angina pectoris: Secondary | ICD-10-CM | POA: Diagnosis not present

## 2018-05-28 DIAGNOSIS — I872 Venous insufficiency (chronic) (peripheral): Secondary | ICD-10-CM | POA: Diagnosis not present

## 2018-05-28 DIAGNOSIS — R7303 Prediabetes: Secondary | ICD-10-CM | POA: Diagnosis not present

## 2018-05-28 DIAGNOSIS — E785 Hyperlipidemia, unspecified: Secondary | ICD-10-CM | POA: Diagnosis not present

## 2018-05-28 DIAGNOSIS — L509 Urticaria, unspecified: Secondary | ICD-10-CM | POA: Diagnosis not present

## 2018-05-29 DIAGNOSIS — I872 Venous insufficiency (chronic) (peripheral): Secondary | ICD-10-CM

## 2018-05-29 DIAGNOSIS — I2510 Atherosclerotic heart disease of native coronary artery without angina pectoris: Secondary | ICD-10-CM

## 2018-05-29 DIAGNOSIS — R7303 Prediabetes: Secondary | ICD-10-CM

## 2018-05-29 DIAGNOSIS — L509 Urticaria, unspecified: Secondary | ICD-10-CM

## 2018-05-29 DIAGNOSIS — E785 Hyperlipidemia, unspecified: Secondary | ICD-10-CM

## 2018-05-30 LAB — CBC WITH DIFFERENTIAL/PLATELET
BASOS ABS: 62 {cells}/uL (ref 0–200)
Basophils Relative: 0.7 %
Eosinophils Absolute: 229 cells/uL (ref 15–500)
Eosinophils Relative: 2.6 %
HEMATOCRIT: 41.9 % (ref 38.5–50.0)
Hemoglobin: 14.2 g/dL (ref 13.2–17.1)
LYMPHS ABS: 2526 {cells}/uL (ref 850–3900)
MCH: 31.3 pg (ref 27.0–33.0)
MCHC: 33.9 g/dL (ref 32.0–36.0)
MCV: 92.5 fL (ref 80.0–100.0)
MPV: 11.6 fL (ref 7.5–12.5)
Monocytes Relative: 9.5 %
NEUTROS PCT: 58.5 %
Neutro Abs: 5148 cells/uL (ref 1500–7800)
PLATELETS: 203 10*3/uL (ref 140–400)
RBC: 4.53 10*6/uL (ref 4.20–5.80)
RDW: 11.8 % (ref 11.0–15.0)
TOTAL LYMPHOCYTE: 28.7 %
WBC: 8.8 10*3/uL (ref 3.8–10.8)
WBCMIX: 836 {cells}/uL (ref 200–950)

## 2018-05-30 LAB — LIPID PANEL
CHOLESTEROL: 160 mg/dL (ref <200)
HDL: 40 mg/dL — ABNORMAL LOW (ref >40)
LDL Cholesterol (Calc): 86 mg/dL (calc)
NON-HDL CHOLESTEROL (CALC): 120 mg/dL (ref <130)
Total CHOL/HDL Ratio: 4 (calc) (ref <5.0)
Triglycerides: 256 mg/dL — ABNORMAL HIGH (ref <150)

## 2018-05-30 LAB — COMPREHENSIVE METABOLIC PANEL
AG RATIO: 1.8 (calc) (ref 1.0–2.5)
ALKALINE PHOSPHATASE (APISO): 70 U/L (ref 40–115)
ALT: 20 U/L (ref 9–46)
AST: 19 U/L (ref 10–35)
Albumin: 4.1 g/dL (ref 3.6–5.1)
BILIRUBIN TOTAL: 0.5 mg/dL (ref 0.2–1.2)
BUN/Creatinine Ratio: 18 (calc) (ref 6–22)
BUN: 20 mg/dL (ref 7–25)
CALCIUM: 9.4 mg/dL (ref 8.6–10.3)
CO2: 27 mmol/L (ref 20–32)
Chloride: 105 mmol/L (ref 98–110)
Creat: 1.12 mg/dL — ABNORMAL HIGH (ref 0.70–1.11)
Globulin: 2.3 g/dL (calc) (ref 1.9–3.7)
Glucose, Bld: 110 mg/dL — ABNORMAL HIGH (ref 65–99)
POTASSIUM: 4.2 mmol/L (ref 3.5–5.3)
SODIUM: 139 mmol/L (ref 135–146)
TOTAL PROTEIN: 6.4 g/dL (ref 6.1–8.1)

## 2018-05-30 LAB — HEMOGLOBIN A1C
HEMOGLOBIN A1C: 6.3 %{Hb} — AB (ref <5.7)
Mean Plasma Glucose: 134 (calc)
eAG (mmol/L): 7.4 (calc)

## 2018-06-03 ENCOUNTER — Other Ambulatory Visit: Payer: Self-pay

## 2018-06-03 ENCOUNTER — Non-Acute Institutional Stay: Payer: Medicare HMO | Admitting: Family

## 2018-06-03 ENCOUNTER — Encounter: Payer: Self-pay | Admitting: Family

## 2018-06-03 VITALS — BP 130/84 | HR 80 | Temp 97.7°F | Resp 18 | Ht 70.0 in | Wt 201.0 lb

## 2018-06-03 DIAGNOSIS — E785 Hyperlipidemia, unspecified: Secondary | ICD-10-CM | POA: Diagnosis not present

## 2018-06-03 DIAGNOSIS — R739 Hyperglycemia, unspecified: Secondary | ICD-10-CM

## 2018-06-03 DIAGNOSIS — Z23 Encounter for immunization: Secondary | ICD-10-CM

## 2018-06-03 DIAGNOSIS — F411 Generalized anxiety disorder: Secondary | ICD-10-CM

## 2018-06-03 DIAGNOSIS — I10 Essential (primary) hypertension: Secondary | ICD-10-CM

## 2018-06-03 DIAGNOSIS — K5901 Slow transit constipation: Secondary | ICD-10-CM | POA: Diagnosis not present

## 2018-06-03 DIAGNOSIS — R69 Illness, unspecified: Secondary | ICD-10-CM | POA: Diagnosis not present

## 2018-06-03 NOTE — Patient Instructions (Signed)
1. Recommend low carbohydrate,low saturated fats, high vegetable diet and regular exercise.  2. Please get your Tdap vaccine administered in pharmacy  3. Please follow up in 6 months get lab work done prior to visit   Insomnia Insomnia is a sleep disorder that makes it difficult to fall asleep or to stay asleep. Insomnia can cause tiredness (fatigue), low energy, difficulty concentrating, mood swings, and poor performance at work or school. There are three different ways to classify insomnia:  Difficulty falling asleep.  Difficulty staying asleep.  Waking up too early in the morning.  Any type of insomnia can be long-term (chronic) or short-term (acute). Both are common. Short-term insomnia usually lasts for three months or less. Chronic insomnia occurs at least three times a week for longer than three months. What are the causes? Insomnia may be caused by another condition, situation, or substance, such as:  Anxiety.  Certain medicines.  Gastroesophageal reflux disease (GERD) or other gastrointestinal conditions.  Asthma or other breathing conditions.  Restless legs syndrome, sleep apnea, or other sleep disorders.  Chronic pain.  Menopause. This may include hot flashes.  Stroke.  Abuse of alcohol, tobacco, or illegal drugs.  Depression.  Caffeine.  Neurological disorders, such as Alzheimer disease.  An overactive thyroid (hyperthyroidism).  The cause of insomnia may not be known. What increases the risk? Risk factors for insomnia include:  Gender. Women are more commonly affected than men.  Age. Insomnia is more common as you get older.  Stress. This may involve your professional or personal life.  Income. Insomnia is more common in people with lower income.  Lack of exercise.  Irregular work schedule or night shifts.  Traveling between different time zones.  What are the signs or symptoms? If you have insomnia, trouble falling asleep or trouble  staying asleep is the main symptom. This may lead to other symptoms, such as:  Feeling fatigued.  Feeling nervous about going to sleep.  Not feeling rested in the morning.  Having trouble concentrating.  Feeling irritable, anxious, or depressed.  How is this treated? Treatment for insomnia depends on the cause. If your insomnia is caused by an underlying condition, treatment will focus on addressing the condition. Treatment may also include:  Medicines to help you sleep.  Counseling or therapy.  Lifestyle adjustments.  Follow these instructions at home:  Take medicines only as directed by your health care provider.  Keep regular sleeping and waking hours. Avoid naps.  Keep a sleep diary to help you and your health care provider figure out what could be causing your insomnia. Include: ? When you sleep. ? When you wake up during the night. ? How well you sleep. ? How rested you feel the next day. ? Any side effects of medicines you are taking. ? What you eat and drink.  Make your bedroom a comfortable place where it is easy to fall asleep: ? Put up shades or special blackout curtains to block light from outside. ? Use a white noise machine to block noise. ? Keep the temperature cool.  Exercise regularly as directed by your health care provider. Avoid exercising right before bedtime.  Use relaxation techniques to manage stress. Ask your health care provider to suggest some techniques that may work well for you. These may include: ? Breathing exercises. ? Routines to release muscle tension. ? Visualizing peaceful scenes.  Cut back on alcohol, caffeinated beverages, and cigarettes, especially close to bedtime. These can disrupt your sleep.  Do not overeat  or eat spicy foods right before bedtime. This can lead to digestive discomfort that can make it hard for you to sleep.  Limit screen use before bedtime. This includes: ? Watching TV. ? Using your smartphone, tablet,  and computer.  Stick to a routine. This can help you fall asleep faster. Try to do a quiet activity, brush your teeth, and go to bed at the same time each night.  Get out of bed if you are still awake after 15 minutes of trying to sleep. Keep the lights down, but try reading or doing a quiet activity. When you feel sleepy, go back to bed.  Make sure that you drive carefully. Avoid driving if you feel very sleepy.  Keep all follow-up appointments as directed by your health care provider. This is important. Contact a health care provider if:  You are tired throughout the day or have trouble in your daily routine due to sleepiness.  You continue to have sleep problems or your sleep problems get worse. Get help right away if:  You have serious thoughts about hurting yourself or someone else. This information is not intended to replace advice given to you by your health care provider. Make sure you discuss any questions you have with your health care provider. Document Released: 06/08/2000 Document Revised: 11/11/2015 Document Reviewed: 03/12/2014 Elsevier Interactive Patient Education  [employer].

## 2018-06-03 NOTE — Progress Notes (Signed)
Location:  [facility] curator of Service:  Clinic (12) Provider: Jamilex Bohnsack FNP-C   Oneal Grout, MD (Inactive)  Patient Care Team: Oneal Grout, MD (Inactive) as PCP - General (Internal Medicine) Lucinda Dell, MD (Dermatology) Malkia Nippert, Donalee Citrin, NP as Nurse Practitioner (Family Medicine)  Extended Emergency Contact Information Primary Emergency Contact: Hobbs,Cindy Address: [address]          [place], [place] [address] [place] of [place] Phone: [phone] Relation: Friend  Goals of care: Advanced Directive information Advanced Directives 06/03/2018  Does Patient Have a Medical Advance Directive? Yes  Type of Estate agent of [place];Living will;Out of facility DNR (pink MOST or yellow form)  Does patient want to make changes to medical advance directive? No - Patient declined  Copy of Healthcare Power of Attorney in Chart? Yes - validated most recent copy scanned in chart (See row information)  Pre-existing out of facility DNR order (yellow form or pink MOST form) Yellow form placed in chart (order not valid for inpatient use);Pink MOST form placed in chart (order not valid for inpatient use)     Chief Complaint  Patient presents with  . Medical Management of Chronic Issues    4 month follow up    HPI:  Pt is a 84 y.o. male seen today [facility] for medical management of chronic diseases.He denies any acute issues during visit.  Hypertension - denies any headache,dizziness,chest pain or shortness of breath.He takes metoprolol 75 mg tablet twice daily and ASA.  Hyperlipidemia - recent triglyceride higher than the previous.states has been eating ice cream on a daily.does not do exercise as he used to but states walks long distance from his apartment to the facility dinning room.he states will be moving closer soon and will try to exercise at the facility gym.   Anxiety -  Ativan has been effective.   Constipation -  bowel movement irregular though states does not eat veggies nor drinks enough water.He takes senna as needed.     Past Medical History:  Diagnosis Date  . Anxiety   . AV block, 1st degree 08/06/07  . Back pain   . Cellulitis of lower extremity 01/11/2015  . Coccygodynia 01/31/10  . Coronary artery disease   . Diverticulosis 1980  . Dupuytren contracture 08/06/07   4th finger left hand  . Essential hypertension, malignant   . Gastric ulcer 12/30/01  . Hearing loss 01/16/05  . Hyperhidrosis, focal, primary 08/06/07  . Hyperlipemia   . Insomnia   . Internal thrombosed hemorrhoids 04/30/01  . Keratosis, actinic 08/03/09  . Slowing of urinary stream 08/04/07  . Vitamin D deficiency 05/27/09   Past Surgical History:  Procedure Laterality Date  . CATARACT EXTRACTION W/ INTRAOCULAR LENS IMPLANT Right 1990   Dr. Emmit Pomfret  . CATARACT EXTRACTION W/ INTRAOCULAR LENS IMPLANT Left 1992   Dr. Emmit Pomfret  . COLONOSCOPY  2003   Diverticulosis  . CORONARY ARTERY BYPASS GRAFT  1998   [facility]  . RECTAL POLYPECTOMY  1971   Dr. Elesa Hacker    Allergies  Allergen Reactions  . Furosemide Itching  . Orange Juice [Orange Oil] Other (See Comments)    indegestion    Allergies as of 06/03/2018      Reactions   Furosemide Itching   Orange Juice [orange Oil] Other (See Comments)   indegestion      Medication List        Accurate as of 06/03/18  1:22 PM. Always use your most recent med list.          aspirin 81 MG tablet Take 81 mg by mouth daily.   LORazepam 1 MG tablet Commonly known as:  ATIVAN Take 1 tablet (1 mg total) by mouth daily.   metoprolol tartrate 50 MG tablet Commonly known as:  LOPRESSOR Take 1.5 tablets (75 mg total) by mouth 2 (two) times daily.   potassium chloride 10 MEQ tablet Commonly known as:  K-DUR Take 10 mEq by mouth daily as needed (take with torsemide).   rosuvastatin 10 MG tablet Commonly known as:  CRESTOR TAKE 1 TABLET DAILY TO LOWER CHOLESTEROL   senna 8.6  MG Tabs tablet Commonly known as:  SENOKOT Take 1 tablet (8.6 mg total) by mouth daily as needed for mild constipation.   SYSTANE BALANCE 0.6 % Soln Generic drug:  Propylene Glycol Apply 1 drop to eye as needed. Both eyes   torsemide 20 MG tablet Commonly known as:  DEMADEX Take 10 mg by mouth as needed for edema.   triamcinolone cream 0.1 % Commonly known as:  KENALOG Apply 1 application topically as needed.   Vitamin D-3 25 MCG (1000 UT) Caps Take 2 capsules by mouth daily.       Review of Systems  Constitutional: Negative for appetite change, chills, fatigue, fever and unexpected weight change.  HENT: Positive for hearing loss. Negative for congestion, dental problem, rhinorrhea, sinus pressure, sinus pain, sneezing and sore throat.        Sees dentist annual  Eyes: Positive for visual disturbance. Negative for discharge, redness and itching.       Wears eye glasses has not seen ophthalmology in 3 years.  Respiratory: Negative for cough, chest tightness, shortness of breath and wheezing.   Cardiovascular: Positive for leg swelling. Negative for chest pain and palpitations.  Gastrointestinal: Negative for abdominal distention, abdominal pain, constipation, diarrhea, nausea and vomiting.  Endocrine: Negative for cold intolerance, heat intolerance, polydipsia, polyphagia and polyuria.  Genitourinary: Positive for frequency. Negative for dysuria, flank pain and urgency.       Urine frequency only at bedtime gets up several times at night.   Musculoskeletal: Negative for back pain and gait problem.       No longer taking soma for pain.low back pain resolved.   Skin: Negative for color change, pallor, rash and wound.  Neurological: Negative for dizziness, weakness, light-headedness and headaches.  Hematological: Does not bruise/bleed easily.  Psychiatric/Behavioral: Positive for sleep disturbance. Negative for agitation and confusion.       Difficulties fall asleep though states  watches TV until late.     Immunization History  Administered Date(s) Administered  . DTaP 07/23/2006  . Influenza, High Dose Seasonal PF 03/21/2018  . Influenza-Unspecified 04/08/2014, 03/29/2015, 04/02/2016, 03/22/2017  . Pneumococcal Conjugate-13 01/20/2015  . Pneumococcal Polysaccharide-23 04/08/2006  . Zoster 07/02/2011   Pertinent  Health Maintenance Due  Topic Date Due  . INFLUENZA VACCINE  Completed  . PNA vac Low Risk Adult  Completed   Fall Risk  06/03/2018 01/27/2018 02/14/2016 08/16/2015 06/07/2015  Falls in the past year? 0 No No No No  Number falls in past yr: 0 - - - -  Injury with Fall? 0 - - - -    Vitals:   06/03/18 1233  BP: 130/84  Pulse: 80  Resp: 18  Temp: 97.7 F (36.5 C)  TempSrc: Oral  SpO2: 92%  Weight: 201 lb (91.2 kg)  Height: 5\' 10"  (1.778  m)   Body mass index is 28.84 kg/m. Physical Exam  Constitutional: He is oriented to person, place, and time. He appears well-developed and well-nourished. No distress.  HENT:  Head: Normocephalic.  Right Ear: External ear normal.  Left Ear: External ear normal.  Mouth/Throat: Oropharynx is clear and moist. No oropharyngeal exudate.  Eyes: Pupils are equal, round, and reactive to light. Conjunctivae and EOM are normal. Right eye exhibits no discharge. Left eye exhibits no discharge. No scleral icterus.  Neck: Normal range of motion. No JVD present. No thyromegaly present.  Cardiovascular: Normal rate, regular rhythm, normal heart sounds and intact distal pulses. Exam reveals no gallop and no friction rub.  No murmur heard. Pulmonary/Chest: Effort normal and breath sounds normal. No respiratory distress. He has no wheezes. He has no rales.  Abdominal: Soft. Bowel sounds are normal. He exhibits no distension and no mass. There is no tenderness. There is no rebound and no guarding.  Musculoskeletal: Normal range of motion. He exhibits no tenderness.  Lower extremities trace edema  Lymphadenopathy:    He has  no cervical adenopathy.  Neurological: He is oriented to person, place, and time.  Skin: Skin is warm and dry. No rash noted. No erythema. No pallor.  Psychiatric: He has a normal mood and affect. His speech is normal and behavior is normal. Judgment and thought content normal.  Vitals reviewed.   Labs reviewed: Recent Labs    11/14/17 0000 05/28/18 0000  NA 141 139  K 4.4 4.2  CL 107 105  CO2 26 27  GLUCOSE 97 110*  BUN 20 20  CREATININE 1.04 1.12*  CALCIUM 9.3 9.4   Recent Labs    11/14/17 0000 05/28/18 0000  AST 21 19  ALT 20 20  BILITOT 0.5 0.5  PROT 6.5 6.4   Recent Labs    11/14/17 0000 05/28/18 0000  WBC 8.2 8.8  NEUTROABS 4,535 5,148  HGB 14.0 14.2  HCT 40.8 41.9  MCV 91.5 92.5  PLT 178 203   Lab Results  Component Value Date   TSH 3.88 11/14/2017   Lab Results  Component Value Date   HGBA1C 6.3 (H) 05/28/2018   Lab Results  Component Value Date   CHOL 160 05/28/2018   HDL 40 (L) 05/28/2018   LDLCALC 86 05/28/2018   TRIG 256 (H) 05/28/2018   CHOLHDL 4.0 05/28/2018    Significant Diagnostic Results in last 30 days:  No results found.  Assessment/Plan 1. Essential hypertension B/p stable.continue on metoprolol 75 mg tablet twice daily,torsemide 10 mg tablet as needed.on potassium supplement.ASA daily for chest pain prophylaxis. CBC/diff,CMP,future.   2. Hyperlipidemia, unspecified hyperlipidemia type Continue on Crestor 10 mg tablet and diet and life style modification also discussed.Lipid panel future.  3. Generalized anxiety disorder Stable on lorazepam 1 mg tablet daily.   4. Slow transit constipation Continue senna daily as needed.increase fiber in diet and hydration.   5. Hyperglycemia Recent serum glucose high.enouraged low carbo,low concentrated sweets diet. Hgb A1C, future.  6. Need for Tdap vaccine  Hard script written for patient to take to pharmacy to administer Tdap vaccine.   Family/ staff Communication: Reviewed plan  of care with patient.  Labs/tests ordered: CBC/diff,CMP,Hgb A1C and Lipid panel, future  Gor Vestal C Amiliana Foutz, NP

## 2018-07-22 ENCOUNTER — Other Ambulatory Visit: Payer: Self-pay | Admitting: *Deleted

## 2018-07-22 DIAGNOSIS — F419 Anxiety disorder, unspecified: Secondary | ICD-10-CM

## 2018-07-22 MED ORDER — LORAZEPAM 1 MG PO TABS: 1.0000 mg | ORAL_TABLET | Freq: Every day | ORAL | 0 refills | Status: DC

## 2018-09-30 ENCOUNTER — Other Ambulatory Visit: Payer: Self-pay

## 2018-09-30 DIAGNOSIS — I2510 Atherosclerotic heart disease of native coronary artery without angina pectoris: Secondary | ICD-10-CM

## 2018-09-30 DIAGNOSIS — F419 Anxiety disorder, unspecified: Secondary | ICD-10-CM

## 2018-09-30 MED ORDER — LORAZEPAM 1 MG PO TABS: 1.0000 mg | ORAL_TABLET | Freq: Every day | ORAL | 0 refills | Status: DC

## 2018-09-30 MED ORDER — METOPROLOL TARTRATE 50 MG PO TABS: 75.0000 mg | ORAL_TABLET | Freq: Two times a day (BID) | ORAL | 1 refills | Status: DC

## 2018-10-06 ENCOUNTER — Other Ambulatory Visit: Payer: Self-pay | Admitting: *Deleted

## 2018-10-06 DIAGNOSIS — I2510 Atherosclerotic heart disease of native coronary artery without angina pectoris: Secondary | ICD-10-CM

## 2018-10-06 MED ORDER — METOPROLOL TARTRATE 50 MG PO TABS: 75.0000 mg | ORAL_TABLET | Freq: Two times a day (BID) | ORAL | 1 refills | Status: DC

## 2018-12-01 ENCOUNTER — Other Ambulatory Visit: Payer: Self-pay

## 2018-12-01 ENCOUNTER — Other Ambulatory Visit: Payer: Medicare HMO

## 2018-12-01 DIAGNOSIS — I10 Essential (primary) hypertension: Secondary | ICD-10-CM

## 2018-12-01 DIAGNOSIS — R739 Hyperglycemia, unspecified: Secondary | ICD-10-CM | POA: Diagnosis not present

## 2018-12-01 DIAGNOSIS — E785 Hyperlipidemia, unspecified: Secondary | ICD-10-CM

## 2018-12-02 ENCOUNTER — Encounter: Payer: Self-pay | Admitting: Family

## 2018-12-02 LAB — COMPLETE METABOLIC PANEL WITH GFR
AG Ratio: 1.6 (calc) (ref 1.0–2.5)
ALT: 18 U/L (ref 9–46)
AST: 16 U/L (ref 10–35)
Albumin: 3.9 g/dL (ref 3.6–5.1)
Alkaline phosphatase (APISO): 72 U/L (ref 35–144)
BUN: 23 mg/dL (ref 7–25)
CO2: 27 mmol/L (ref 20–32)
Calcium: 9 mg/dL (ref 8.6–10.3)
Chloride: 107 mmol/L (ref 98–110)
Creat: 1.08 mg/dL (ref 0.70–1.11)
GFR, Est African American: 69 mL/min/{1.73_m2} (ref >=60)
GFR, Est Non African American: 60 mL/min/{1.73_m2} (ref >=60)
Globulin: 2.4 g/dL (calc) (ref 1.9–3.7)
Glucose, Bld: 103 mg/dL — ABNORMAL HIGH (ref 65–99)
Potassium: 4.5 mmol/L (ref 3.5–5.3)
Sodium: 142 mmol/L (ref 135–146)
Total Bilirubin: 0.5 mg/dL (ref 0.2–1.2)
Total Protein: 6.3 g/dL (ref 6.1–8.1)

## 2018-12-02 LAB — CBC WITH DIFFERENTIAL/PLATELET
Absolute Monocytes: 773 cells/uL (ref 200–950)
Basophils Absolute: 64 cells/uL (ref 0–200)
Basophils Relative: 0.7 %
Eosinophils Absolute: 267 cells/uL (ref 15–500)
Eosinophils Relative: 2.9 %
HCT: 42 % (ref 38.5–50.0)
Hemoglobin: 14.2 g/dL (ref 13.2–17.1)
Lymphs Abs: 3082 cells/uL (ref 850–3900)
MCH: 32 pg (ref 27.0–33.0)
MCHC: 33.8 g/dL (ref 32.0–36.0)
MCV: 94.6 fL (ref 80.0–100.0)
MPV: 11.5 fL (ref 7.5–12.5)
Monocytes Relative: 8.4 %
Neutro Abs: 5014 cells/uL (ref 1500–7800)
Neutrophils Relative %: 54.5 %
Platelets: 204 10*3/uL (ref 140–400)
RBC: 4.44 10*6/uL (ref 4.20–5.80)
RDW: 11.8 % (ref 11.0–15.0)
Total Lymphocyte: 33.5 %
WBC: 9.2 10*3/uL (ref 3.8–10.8)

## 2018-12-02 LAB — LIPID PANEL
Cholesterol: 156 mg/dL (ref <200)
HDL: 42 mg/dL (ref >=40)
LDL Cholesterol (Calc): 85 mg/dL (calc)
Non-HDL Cholesterol (Calc): 114 mg/dL (calc) (ref <130)
Total CHOL/HDL Ratio: 3.7 (calc) (ref <5.0)
Triglycerides: 195 mg/dL — ABNORMAL HIGH (ref <150)

## 2018-12-02 LAB — HEMOGLOBIN A1C
Hgb A1c MFr Bld: 6.3 % of total Hgb — ABNORMAL HIGH (ref <5.7)
Mean Plasma Glucose: 134 (calc)
eAG (mmol/L): 7.4 (calc)

## 2018-12-03 ENCOUNTER — Encounter: Payer: Self-pay | Admitting: Internal Medicine

## 2018-12-03 ENCOUNTER — Other Ambulatory Visit: Payer: Self-pay

## 2018-12-03 ENCOUNTER — Non-Acute Institutional Stay: Payer: Medicare HMO | Admitting: Internal Medicine

## 2018-12-03 DIAGNOSIS — E7849 Other hyperlipidemia: Secondary | ICD-10-CM | POA: Diagnosis not present

## 2018-12-03 DIAGNOSIS — F419 Anxiety disorder, unspecified: Secondary | ICD-10-CM

## 2018-12-03 DIAGNOSIS — R69 Illness, unspecified: Secondary | ICD-10-CM | POA: Diagnosis not present

## 2018-12-03 DIAGNOSIS — K219 Gastro-esophageal reflux disease without esophagitis: Secondary | ICD-10-CM

## 2018-12-03 DIAGNOSIS — I2510 Atherosclerotic heart disease of native coronary artery without angina pectoris: Secondary | ICD-10-CM | POA: Diagnosis not present

## 2018-12-03 NOTE — Progress Notes (Signed)
Location:      Place of Service:     Provider:   Code Status:  Goals of Care:  Advanced Directives 06/03/2018  Does Patient Have a Medical Advance Directive? Yes  Type of Estate agentAdvance Directive Healthcare Power of [place];Living will;Out of facility DNR (pink MOST or yellow form)  Does patient want to make changes to medical advance directive? No - Patient declined  Copy of Healthcare Power of Attorney in Chart? Yes - validated most recent copy scanned in chart (See row information)  Pre-existing out of facility DNR order (yellow form or pink MOST form) Yellow form placed in chart (order not valid for inpatient use);Pink MOST form placed in chart (order not valid for inpatient use)     Chief Complaint  Patient presents with  . Medical Management of Chronic Issues    6 month follow up with labs, would like to discuss acid reflux     HPI: Patient is a 85 y.o. male seen today for medical management of chronic diseases.   Patient has h/o Hypertension, LE edema, Hyperlipidemia, Anxiety, CAD  He Came for his regular Follow up and Mostly to get Refill for his Meds He has chronic Issues of Reflux and wanted to know if he can take something OTC. Also Has h/o Chronic Dermatitis mostly resolved with Kenalog Cream He does not want to See dermatologist. He also Has LE edema and suppose to take Demadex which he takes erratically. But he is very active and still drives. Sleeps good on Ativan No Falls recently Lives by himself. Spouse Passed away few years Ago  Past Medical History:  Diagnosis Date  . Anxiety   . AV block, 1st degree 08/06/07  . Back pain   . Cellulitis of lower extremity 01/11/2015  . Coccygodynia 01/31/10  . Coronary artery disease   . Diverticulosis 1980  . Dupuytren contracture 08/06/07   4th finger left hand  . Essential hypertension, malignant   . Gastric ulcer 12/30/01  . Hearing loss 01/16/05  . Hyperhidrosis, focal, primary 08/06/07  . Hyperlipemia   . Insomnia    . Internal thrombosed hemorrhoids 04/30/01  . Keratosis, actinic 08/03/09  . Slowing of urinary stream 08/04/07  . Vitamin D deficiency 05/27/09    Past Surgical History:  Procedure Laterality Date  . CATARACT EXTRACTION W/ INTRAOCULAR LENS IMPLANT Right 1990   Dr. Emmit PomfretEpes  . CATARACT EXTRACTION W/ INTRAOCULAR LENS IMPLANT Left 1992   Dr. Emmit PomfretEpes  . COLONOSCOPY  2003   Diverticulosis  . CORONARY ARTERY BYPASS GRAFT  1998   [facility]  . RECTAL POLYPECTOMY  1971   Dr. Elesa Hackereaton    Allergies  Allergen Reactions  . Furosemide Itching  . Orange Juice [Orange Oil] Other (See Comments)    indegestion    Outpatient Encounter Medications as of 12/03/2018  Medication Sig  . aspirin 81 MG tablet Take 81 mg by mouth daily.  . Cholecalciferol (VITAMIN D-3) 1000 UNITS CAPS Take 2 capsules by mouth daily.   Marland Kitchen. LORazepam (ATIVAN) 1 MG tablet Take 1 tablet (1 mg total) by mouth daily.  . metoprolol tartrate (LOPRESSOR) 50 MG tablet Take 1.5 tablets (75 mg total) by mouth 2 (two) times daily.  . potassium chloride (K-DUR) 10 MEQ tablet Take 10 mEq by mouth daily as needed (take with torsemide).   . Propylene Glycol (SYSTANE BALANCE) 0.6 % SOLN Apply 1 drop to eye as needed. Both eyes   . rosuvastatin (CRESTOR) 10 MG tablet TAKE  1 TABLET DAILY TO LOWER CHOLESTEROL  . torsemide (DEMADEX) 20 MG tablet Take 10 mg by mouth as needed for edema.  . triamcinolone cream (KENALOG) 0.1 % Apply 1 application topically as needed.  . [DISCONTINUED] senna (SENOKOT) 8.6 MG TABS tablet Take 1 tablet (8.6 mg total) by mouth daily as needed for mild constipation.   No facility-administered encounter medications on file as of 12/03/2018.     Review of Systems:  Review of Systems  Review of Systems  Constitutional: Negative for activity change, appetite change, chills, diaphoresis, fatigue and fever.  HENT: Negative for mouth sores, postnasal drip, rhinorrhea, sinus pain and sore throat.   Respiratory: Negative for  apnea, cough, chest tightness, shortness of breath and wheezing.   Cardiovascular: Negative for chest pain, palpitations Positive for Swelling  Gastrointestinal: Negative for abdominal distention, abdominal pain, constipation, diarrhea, nausea and vomiting. Positive for Reflux Genitourinary: Negative for dysuria and frequency.  Musculoskeletal: Negative for arthralgias, joint swelling and myalgias.  Skin: Positive For Rash Neurological: Negative for dizziness, syncope, weakness, light-headedness and numbness.  Psychiatric/Behavioral: Negative for behavioral problems, confusion and sleep disturbance.     Health Maintenance  Topic Date Due  . TETANUS/TDAP  10/18/1944  . INFLUENZA VACCINE  01/24/2019  . PNA vac Low Risk Adult  Completed    Physical Exam: Vitals:   12/03/18 1406  BP: 138/82  Pulse: 75  Temp: 97.7 F (36.5 C)  TempSrc: Oral  SpO2: 96%  Weight: 200 lb 6.4 oz (90.9 kg)  Height: 5\' 10"  (1.778 m)   Body mass index is 28.75 kg/m. Physical Exam  Constitutional: Oriented to person, place, and time. Well-developed and well-nourished.  HENT:  Head: Normocephalic.  Mouth/Throat: Oropharynx is clear and moist.  Eyes: Pupils are equal, round, and reactive to light.  Neck: Neck supple.  Cardiovascular: Normal rate and normal heart sounds.  No murmur heard. Pulmonary/Chest: Effort normal and breath sounds normal. No respiratory distress. No wheezes. She has no rales.  Abdominal: Soft. Bowel sounds are normal. No distension. There is no tenderness. There is no rebound.  Musculoskeletal: Has Mild Edema Bilateral Lymphadenopathy: none Neurological: Alert and oriented to person, place, and time.  Gait was normal Skin: Skin is warm and dry.  Psychiatric: Normal mood and affect. Behavior is normal. Thought content normal.    Labs reviewed: Basic Metabolic Panel: Recent Labs    05/28/18 0000 12/01/18 0800  NA 139 142  K 4.2 4.5  CL 105 107  CO2 27 27  GLUCOSE 110*  103*  BUN 20 23  CREATININE 1.12* 1.08  CALCIUM 9.4 9.0   Liver Function Tests: Recent Labs    05/28/18 0000 12/01/18 0800  AST 19 16  ALT 20 18  BILITOT 0.5 0.5  PROT 6.4 6.3   No results for input(s): LIPASE, AMYLASE in the last 8760 hours. No results for input(s): AMMONIA in the last 8760 hours. CBC: Recent Labs    05/28/18 0000 12/01/18 0800  WBC 8.8 9.2  NEUTROABS 5,148 5,014  HGB 14.2 14.2  HCT 41.9 42.0  MCV 92.5 94.6  PLT 203 204   Lipid Panel: Recent Labs    05/28/18 0000 12/01/18 0800  CHOL 160 156  HDL 40* 42  LDLCALC 86 85  TRIG 256* 195*  CHOLHDL 4.0 3.7   Lab Results  Component Value Date   HGBA1C 6.3 (H) 12/01/2018    Procedures since last visit: No results found.  Assessment/Plan 1. Anxiety disorder, unspecified type D/W him About the  risk of falls with Ativan - LORazepam (ATIVAN) 1 MG tablet; Take 1 tablet (1 mg total) by mouth daily.  Dispense: 30 tablet; Refill: 0  2. Arteriosclerosis of coronary artery Asymptomatic On Aspirin, Beta Blocker and Statin - metoprolol tartrate (LOPRESSOR) 50 MG tablet; Take 1.5 tablets (75 mg total) by mouth 2 (two) times daily.  Dispense: 270 tablet; Refill: 1  3. Other hyperlipidemia Continue Statin LDL less then 100 - rosuvastatin (CRESTOR) 10 MG tablet; TAKE 1 TABLET DAILY TO LOWER CHOLESTEROL  Dispense: 90 tablet; Refill: 3 LE edema Continue Taking Demadex PRN with Potassium GERD Can Try OTC Pepcid for few days Chronic Dermatitis Does not want to Follow with Dermatology Will continue Kenalog PRn  Labs/tests ordered:  @ORDERS @ Next appt:  Visit date not found  Total time spent in this patient care encounter was  40_  minutes; greater than 50% of the visit spent counseling patient and staff, reviewing records , Labs and coordinating care for problems addressed at this encounter.

## 2018-12-07 DIAGNOSIS — K219 Gastro-esophageal reflux disease without esophagitis: Secondary | ICD-10-CM | POA: Insufficient documentation

## 2018-12-07 MED ORDER — TORSEMIDE 20 MG PO TABS: 10.0000 mg | ORAL_TABLET | ORAL | 3 refills | Status: DC | PRN

## 2018-12-07 MED ORDER — POTASSIUM CHLORIDE ER 10 MEQ PO TBCR: 10.0000 meq | EXTENDED_RELEASE_TABLET | Freq: Every day | ORAL | 3 refills | Status: DC | PRN

## 2018-12-07 MED ORDER — LORAZEPAM 1 MG PO TABS: 1.0000 mg | ORAL_TABLET | Freq: Every day | ORAL | 0 refills | Status: DC

## 2018-12-07 MED ORDER — METOPROLOL TARTRATE 50 MG PO TABS: 75.0000 mg | ORAL_TABLET | Freq: Two times a day (BID) | ORAL | 1 refills | Status: DC

## 2018-12-07 MED ORDER — ROSUVASTATIN CALCIUM 10 MG PO TABS: ORAL_TABLET | ORAL | 3 refills | Status: DC

## 2018-12-07 MED ORDER — TRIAMCINOLONE ACETONIDE 0.1 % EX CREA: 1.0000 "application " | TOPICAL_CREAM | CUTANEOUS | 0 refills | Status: DC | PRN

## 2019-01-15 ENCOUNTER — Telehealth: Payer: Self-pay | Admitting: *Deleted

## 2019-01-15 NOTE — Telephone Encounter (Signed)
Received fax from [employer] [phone] stating Lorazepam was APPROVED from 12/16/18-01/15/20  Case Id: [id number]

## 2019-01-15 NOTE — Telephone Encounter (Signed)
Received Prior Authorization from [place] for Lorazepam.  Initiated through CoverMyMeds [id number] Response in 48-72 hours.

## 2019-01-26 ENCOUNTER — Other Ambulatory Visit: Payer: Self-pay | Admitting: *Deleted

## 2019-01-26 DIAGNOSIS — F419 Anxiety disorder, unspecified: Secondary | ICD-10-CM

## 2019-01-26 MED ORDER — LORAZEPAM 1 MG PO TABS: 1.0000 mg | ORAL_TABLET | Freq: Every day | ORAL | 0 refills | Status: DC

## 2019-01-26 NOTE — Telephone Encounter (Signed)
Received refill Request from [facility]. Pended Rx and sent to Dr. Lyndel Safe for approval.

## 2019-03-25 DIAGNOSIS — R69 Illness, unspecified: Secondary | ICD-10-CM | POA: Diagnosis not present

## 2019-05-28 ENCOUNTER — Other Ambulatory Visit: Payer: Medicare HMO

## 2019-05-28 ENCOUNTER — Other Ambulatory Visit: Payer: Self-pay

## 2019-05-28 DIAGNOSIS — E7849 Other hyperlipidemia: Secondary | ICD-10-CM

## 2019-05-28 DIAGNOSIS — I2510 Atherosclerotic heart disease of native coronary artery without angina pectoris: Secondary | ICD-10-CM | POA: Diagnosis not present

## 2019-05-29 LAB — HEMOGLOBIN A1C
Hgb A1c MFr Bld: 6.2 % of total Hgb — ABNORMAL HIGH (ref <5.7)
Mean Plasma Glucose: 131 (calc)
eAG (mmol/L): 7.3 (calc)

## 2019-05-29 LAB — COMPLETE METABOLIC PANEL WITH GFR
AG Ratio: 1.8 (calc) (ref 1.0–2.5)
ALT: 15 U/L (ref 9–46)
AST: 16 U/L (ref 10–35)
Albumin: 4.1 g/dL (ref 3.6–5.1)
Alkaline phosphatase (APISO): 69 U/L (ref 35–144)
BUN: 21 mg/dL (ref 7–25)
CO2: 24 mmol/L (ref 20–32)
Calcium: 9.2 mg/dL (ref 8.6–10.3)
Chloride: 108 mmol/L (ref 98–110)
Creat: 1.07 mg/dL (ref 0.70–1.11)
GFR, Est African American: 70 mL/min/{1.73_m2} (ref >=60)
GFR, Est Non African American: 60 mL/min/{1.73_m2} (ref >=60)
Globulin: 2.3 g/dL (calc) (ref 1.9–3.7)
Glucose, Bld: 107 mg/dL — ABNORMAL HIGH (ref 65–99)
Potassium: 4.3 mmol/L (ref 3.5–5.3)
Sodium: 141 mmol/L (ref 135–146)
Total Bilirubin: 0.5 mg/dL (ref 0.2–1.2)
Total Protein: 6.4 g/dL (ref 6.1–8.1)

## 2019-05-29 LAB — LIPID PANEL
Cholesterol: 154 mg/dL (ref <200)
HDL: 42 mg/dL (ref >=40)
LDL Cholesterol (Calc): 87 mg/dL (calc)
Non-HDL Cholesterol (Calc): 112 mg/dL (calc) (ref <130)
Total CHOL/HDL Ratio: 3.7 (calc) (ref <5.0)
Triglycerides: 158 mg/dL — ABNORMAL HIGH (ref <150)

## 2019-06-03 ENCOUNTER — Other Ambulatory Visit: Payer: Self-pay

## 2019-06-03 ENCOUNTER — Non-Acute Institutional Stay: Payer: Medicare HMO | Admitting: Internal Medicine

## 2019-06-03 ENCOUNTER — Encounter: Payer: Self-pay | Admitting: Internal Medicine

## 2019-06-03 VITALS — BP 176/90 | HR 77 | Temp 97.7°F | Ht 70.0 in | Wt 199.6 lb

## 2019-06-03 DIAGNOSIS — R609 Edema, unspecified: Secondary | ICD-10-CM

## 2019-06-03 DIAGNOSIS — E559 Vitamin D deficiency, unspecified: Secondary | ICD-10-CM

## 2019-06-03 DIAGNOSIS — R69 Illness, unspecified: Secondary | ICD-10-CM | POA: Diagnosis not present

## 2019-06-03 DIAGNOSIS — I10 Essential (primary) hypertension: Secondary | ICD-10-CM

## 2019-06-03 DIAGNOSIS — E7849 Other hyperlipidemia: Secondary | ICD-10-CM | POA: Diagnosis not present

## 2019-06-03 DIAGNOSIS — F419 Anxiety disorder, unspecified: Secondary | ICD-10-CM

## 2019-06-03 DIAGNOSIS — R7303 Prediabetes: Secondary | ICD-10-CM | POA: Diagnosis not present

## 2019-06-03 DIAGNOSIS — L509 Urticaria, unspecified: Secondary | ICD-10-CM | POA: Diagnosis not present

## 2019-06-03 MED ORDER — LORAZEPAM 1 MG PO TABS: 1.0000 mg | ORAL_TABLET | Freq: Every day | ORAL | 1 refills | Status: DC

## 2019-06-03 MED ORDER — ROSUVASTATIN CALCIUM 10 MG PO TABS: ORAL_TABLET | ORAL | 1 refills | Status: DC

## 2019-06-03 MED ORDER — TRIAMCINOLONE ACETONIDE 0.1 % EX CREA: 1.0000 "application " | TOPICAL_CREAM | CUTANEOUS | 0 refills | Status: DC | PRN

## 2019-06-03 MED ORDER — METOPROLOL TARTRATE 50 MG PO TABS: 50.0000 mg | ORAL_TABLET | Freq: Two times a day (BID) | ORAL | 1 refills | Status: DC

## 2019-06-03 NOTE — Progress Notes (Signed)
Location:  [facility] Service:  Clinic (12)  Provider:   Code Status:  Goals of Care:  Advanced Directives 06/03/2018  Does Patient Have a Medical Advance Directive? Yes  Type of Estate agent of [place];Living will;Out of facility DNR (pink MOST or yellow form)  Does patient want to make changes to medical advance directive? No - Patient declined  Copy of Healthcare Power of Attorney in Chart? Yes - validated most recent copy scanned in chart (See row information)  Pre-existing out of facility DNR order (yellow form or pink MOST form) Yellow form placed in chart (order not valid for inpatient use);Pink MOST form placed in chart (order not valid for inpatient use)     Chief Complaint  Patient presents with  . Medical Management of Chronic Issues    6 month follow up  . Health Maintenance    TDAP and influenza vaccine    HPI: Patient is a 85 y.o. male seen today for medical management of chronic diseases.   Patient has h/o Hypertension, LE edema, Hyperlipidemia, Anxiety, CAD  Hypertension It turns out that patient takes metoprolol 25 mg in the evenings and 50 mg in the morning.  At some point he was told to take 75 mg twice daily.  But he decided to take 75 once a day in a split dose.  His blood pressure was elevated today.  He is not checking his blood pressures at home. Nonspecific rash Patient gets these rashes off-and-on and he uses triamcinolone cream with complete relief.  Today he had 2 spider veins like rash in LE .  They do not bother him or itch He did say he does have something on  his upper part of lip which keeps coming back. Anxiety Symptoms controlled with Ativan as needed Lower extremity edema Has not been taking Demadex recently. CAD Asymptomatic and takes statin and aspirin  Patient lives by himself in [place].  He still drives and is independent in his ADLs and IADLs.   does not have any children.  Has a nephew in  [place] who is his POA  Past Medical History:  Diagnosis Date  . Anxiety   . AV block, 1st degree 08/06/07  . Back pain   . Cellulitis of lower extremity 01/11/2015  . Coccygodynia 01/31/10  . Coronary artery disease   . Diverticulosis 1980  . Dupuytren contracture 08/06/07   4th finger left hand  . Essential hypertension, malignant   . Gastric ulcer 12/30/01  . Hearing loss 01/16/05  . Hyperhidrosis, focal, primary 08/06/07  . Hyperlipemia   . Insomnia   . Internal thrombosed hemorrhoids 04/30/01  . Keratosis, actinic 08/03/09  . Slowing of urinary stream 08/04/07  . Vitamin D deficiency 05/27/09    Past Surgical History:  Procedure Laterality Date  . CATARACT EXTRACTION W/ INTRAOCULAR LENS IMPLANT Right 1990   Dr. Emmit Pomfret  . CATARACT EXTRACTION W/ INTRAOCULAR LENS IMPLANT Left 1992   Dr. Emmit Pomfret  . COLONOSCOPY  2003   Diverticulosis  . CORONARY ARTERY BYPASS GRAFT  1998   Dr [facility]ile [facility]  . RECTAL POLYPECTOMY  1971   Dr. Elesa Hacker    Allergies  Allergen Reactions  . Furosemide Itching  . Orange Juice [Orange Oil] Other (See Comments)    indegestion    Outpatient Encounter Medications as of 06/03/2019  Medication Sig  . aspirin 81 MG tablet Take 81 mg by mouth daily.  . Cholecalciferol (VITAMIN D-3) 1000  UNITS CAPS Take 2 capsules by mouth daily.   Marland Kitchen. LORazepam (ATIVAN) 1 MG tablet Take 1 tablet (1 mg total) by mouth daily.  . metoprolol tartrate (LOPRESSOR) 50 MG tablet Take 50 mg by mouth. 1 in morning and 1/2 in evening   . potassium chloride (K-DUR) 10 MEQ tablet Take 1 tablet (10 mEq total) by mouth daily as needed (take with torsemide).  . Propylene Glycol (SYSTANE BALANCE) 0.6 % SOLN Apply 1 drop to eye as needed. Both eyes   . rosuvastatin (CRESTOR) 10 MG tablet TAKE 1 TABLET DAILY TO LOWER CHOLESTEROL  . torsemide (DEMADEX) 20 MG tablet Take 0.5 tablets (10 mg total) by mouth as needed.  . triamcinolone cream (KENALOG) 0.1 % Apply 1 application topically as needed.  .  [DISCONTINUED] metoprolol tartrate (LOPRESSOR) 50 MG tablet Take 1.5 tablets (75 mg total) by mouth 2 (two) times daily. (Patient taking differently: Take 75 mg by mouth. 1 in morning and 1/2 in evening)   No facility-administered encounter medications on file as of 06/03/2019.     Review of Systems:  Review of Systems  Review of Systems  Constitutional: Negative for activity change, appetite change, chills, diaphoresis, fatigue and fever.  HENT: Negative for mouth sores, postnasal drip, rhinorrhea, sinus pain and sore throat.   Respiratory: Negative for apnea, cough, chest tightness, shortness of breath and wheezing.   Cardiovascular: Negative for chest pain, palpitations Positive for LE swelling.  Gastrointestinal: Negative for abdominal distention, abdominal pain, constipation, diarrhea, nausea and vomiting.  Genitourinary: Negative for dysuria and frequency.  Musculoskeletal: Negative for arthralgias, joint swelling and myalgias.  Skin: Positive for rash Neurological: Negative for dizziness, syncope, weakness, light-headedness and numbness.  Psychiatric/Behavioral: Negative for behavioral problems, confusion and sleep disturbance.     Health Maintenance  Topic Date Due  . TETANUS/TDAP  10/18/1944  . INFLUENZA VACCINE  01/24/2019  . PNA vac Low Risk Adult  Completed    Physical Exam: Vitals:   06/03/19 1311  BP: (!) 176/90 Repeat Manually was 150/90  Pulse: 77  Temp: 97.7 F (36.5 C)  SpO2: 94%  Weight: 199 lb 9.6 oz (90.5 kg)  Height: 5\' 10"  (1.778 m)   Body mass index is 28.64 kg/m. Physical Exam  Constitutional: Oriented to person, place, and time. Well-developed and well-nourished.  HENT:  Head: Normocephalic.  Mouth/Throat: Oropharynx is clear and moist.  Eyes: Pupils are equal, round, and reactive to light.  Neck: Neck supple.  Cardiovascular: Normal rate and normal heart sounds.  No murmur heard. Pulmonary/Chest: Effort normal and breath sounds normal. No  respiratory distress. No wheezes. She has no rales.  Abdominal: Soft. Bowel sounds are normal. No distension. There is no tenderness. There is no rebound.  Musculoskeletal: Moderate Edema Bilateral Left More then Right Lymphadenopathy: none Neurological: Alert and oriented to person, place, and time. No Focal Deficits Gait is steady Skin: Skin is warm and dry. Has 2 small Spider vessels on Bilateral LE Also ? Keratosis on Upper lip Psychiatric: Normal mood and affect. Behavior is normal. Thought content normal.    Labs reviewed: Basic Metabolic Panel: Recent Labs    12/01/18 0800 05/28/19 0750  NA 142 141  K 4.5 4.3  CL 107 108  CO2 27 24  GLUCOSE 103* 107*  BUN 23 21  CREATININE 1.08 1.07  CALCIUM 9.0 9.2   Liver Function Tests: Recent Labs    12/01/18 0800 05/28/19 0750  AST 16 16  ALT 18 15  BILITOT 0.5 0.5  PROT 6.3 6.4   No results for input(s): LIPASE, AMYLASE in the last 8760 hours. No results for input(s): AMMONIA in the last 8760 hours. CBC: Recent Labs    12/01/18 0800  WBC 9.2  NEUTROABS 5,014  HGB 14.2  HCT 42.0  MCV 94.6  PLT 204   Lipid Panel: Recent Labs    12/01/18 0800 05/28/19 0750  CHOL 156 154  HDL 42 42  LDLCALC 85 87  TRIG 195* 158*  CHOLHDL 3.7 3.7   Lab Results  Component Value Date   HGBA1C 6.2 (H) 05/28/2019    Procedures since last visit: No results found.  Assessment/Plan Anxiety disorder, unspecified type - Plan: LORazepam (ATIVAN) 1 MG tablet  Other hyperlipidemia - Plan: rosuvastatin (CRESTOR) 10 MG tablet LDL less then 100 Vitamin D deficiency Repeat The level  Prediabetes A1C was is 6.2 Essential hypertension Elevated today He is going to check his BP at home and with facility Nurse Will bring the log for next appointment  or will call if persistently high  Edema, unspecified type He takes Demadex PRn  Urticaria Triamcinolone PRN Will call Dermatology for follow up of Upper lip Lesion      Labs/tests ordered:  * No order type specified * Next appt:  Visit date not found    Total time spent in this patient care encounter was  45_  minutes; greater than 50% of the visit spent counseling patient and staff, reviewing records , Labs and coordinating care for problems addressed at this encounter.

## 2019-06-08 ENCOUNTER — Telehealth: Payer: Self-pay | Admitting: *Deleted

## 2019-06-08 NOTE — Telephone Encounter (Signed)
Received fax from [facility] regarding patient's Rx for Metoprolol. Needs Clarification of Directions for Metoprolol. Please provide the correct directions of how patient is suppose to be taking medication.  Please Advise.

## 2019-06-09 NOTE — Telephone Encounter (Signed)
According to Patient he takes 50 mg in the morning and 25 mg in the evening

## 2019-06-10 MED ORDER — METOPROLOL TARTRATE 50 MG PO TABS: ORAL_TABLET | ORAL | 1 refills | Status: DC

## 2019-06-10 NOTE — Telephone Encounter (Signed)
Medication list update. Rx sent to Pharmacy.

## 2019-06-10 NOTE — Addendum Note (Signed)
Addended by: Rafael Bihari A on: 06/10/2019 08:20 AM   Modules accepted: Orders

## 2019-07-15 DIAGNOSIS — L820 Inflamed seborrheic keratosis: Secondary | ICD-10-CM | POA: Diagnosis not present

## 2019-07-15 DIAGNOSIS — L905 Scar conditions and fibrosis of skin: Secondary | ICD-10-CM | POA: Diagnosis not present

## 2019-07-15 DIAGNOSIS — L570 Actinic keratosis: Secondary | ICD-10-CM | POA: Diagnosis not present

## 2019-08-11 ENCOUNTER — Other Ambulatory Visit: Payer: Self-pay

## 2019-08-11 ENCOUNTER — Non-Acute Institutional Stay: Payer: Medicare HMO | Admitting: Nurse Practitioner

## 2019-08-11 DIAGNOSIS — I872 Venous insufficiency (chronic) (peripheral): Secondary | ICD-10-CM

## 2019-08-11 DIAGNOSIS — K621 Rectal polyp: Secondary | ICD-10-CM

## 2019-08-11 DIAGNOSIS — F411 Generalized anxiety disorder: Secondary | ICD-10-CM

## 2019-08-11 DIAGNOSIS — I10 Essential (primary) hypertension: Secondary | ICD-10-CM | POA: Diagnosis not present

## 2019-08-11 NOTE — Assessment & Plan Note (Signed)
Prn Torsemide.

## 2019-08-11 NOTE — Progress Notes (Signed)
Location:   clinic [facility]   Place of Service:  Clinic (12) Provider: Chipper Oman NP  Code Status: DNR Goals of Care: [place] [facility] Advanced Directives 06/03/2018  Does Patient Have a Medical Advance Directive? Yes  Type of Estate agent of [place];Living will;Out of facility DNR (pink MOST or yellow form)  Does patient want to make changes to medical advance directive? No - Patient declined  Copy of Healthcare Power of Attorney in Chart? Yes - validated most recent copy scanned in chart (See row information)  Pre-existing out of facility DNR order (yellow form or pink MOST form) Yellow form placed in chart (order not valid for inpatient use);Pink MOST form placed in chart (order not valid for inpatient use)     Chief Complaint  Patient presents with  . Acute Visit    Grade polyp extending out anus.    HPI: Patient is a 86 y.o. male seen today for an acute visit for a protruded rectal polyp recently. Hx of rectal polyp at least about #10 years, usually stays in side of the anus. No constipation.  Hx of anxiety, taking Lorazepam, HTN, blood pressure is controlled on Metoprolol, Chronic edema BLE, stable on Torsemide prn.   Past Medical History:  Diagnosis Date  . Anxiety   . AV block, 1st degree 08/06/07  . Back pain   . Cellulitis of lower extremity 01/11/2015  . Coccygodynia 01/31/10  . Coronary artery disease   . Diverticulosis 1980  . Dupuytren contracture 08/06/07   4th finger left hand  . Essential hypertension, malignant   . Gastric ulcer 12/30/01  . Hearing loss 01/16/05  . Hyperhidrosis, focal, primary 08/06/07  . Hyperlipemia   . Insomnia   . Internal thrombosed hemorrhoids 04/30/01  . Keratosis, actinic 08/03/09  . Slowing of urinary stream 08/04/07  . Vitamin D deficiency 05/27/09    Past Surgical History:  Procedure Laterality Date  . CATARACT EXTRACTION W/ INTRAOCULAR LENS IMPLANT Right 1990   Dr. Emmit Pomfret  . CATARACT EXTRACTION W/ INTRAOCULAR LENS IMPLANT  Left 1992   Dr. Emmit Pomfret  . COLONOSCOPY  2003   Diverticulosis  . CORONARY ARTERY BYPASS GRAFT  1998   [facility]  . RECTAL POLYPECTOMY  1971   Dr. Elesa Hacker    Allergies  Allergen Reactions  . Furosemide Itching  . Orange Juice [Orange Oil] Other (See Comments)    indegestion    Allergies as of 08/11/2019      Reactions   Furosemide Itching   Orange Juice [orange Oil] Other (See Comments)   indegestion      Medication List       Accurate as of August 11, 2019 11:59 PM. If you have any questions, ask your nurse or doctor.        aspirin 81 MG tablet Take 81 mg by mouth daily.   LORazepam 1 MG tablet Commonly known as: ATIVAN Take 1 tablet (1 mg total) by mouth daily.   metoprolol tartrate 50 MG tablet Commonly known as: LOPRESSOR Take one tablet in the morning and take 1/2 tablet in the evening. What changed: additional instructions   potassium chloride 10 MEQ tablet Commonly known as: KLOR-CON Take 1 tablet (10 mEq total) by mouth daily as needed (take with torsemide).   rosuvastatin 10 MG tablet Commonly known as: CRESTOR TAKE 1 TABLET DAILY TO LOWER CHOLESTEROL   Systane Balance 0.6 % Soln Generic drug: Propylene Glycol Apply 1 drop to eye as needed. Both  eyes   torsemide 20 MG tablet Commonly known as: DEMADEX Take 0.5 tablets (10 mg total) by mouth as needed.   triamcinolone cream 0.1 % Commonly known as: KENALOG Apply 1 application topically as needed.   Vitamin D-3 25 MCG (1000 UT) Caps Take 2 capsules by mouth daily.       Review of Systems:  Review of Systems  Constitutional: Negative for activity change, appetite change, chills, diaphoresis, fatigue and fever.  HENT: Positive for hearing loss. Negative for congestion and voice change.   Eyes: Negative for visual disturbance.  Respiratory: Negative for cough, shortness of breath and wheezing.   Cardiovascular: Positive for leg swelling. Negative for chest pain and palpitations.    Gastrointestinal: Positive for rectal pain. Negative for abdominal distention, abdominal pain, anal bleeding, blood in stool, constipation, diarrhea, nausea and vomiting.       A polyp protruding out of anus, pain sometime after BM  Genitourinary: Negative for difficulty urinating, dysuria and urgency.  Musculoskeletal: Positive for arthralgias and gait problem.  Skin: Negative for color change and pallor.  Neurological: Negative for dizziness, speech difficulty, weakness and headaches.  Psychiatric/Behavioral: Negative for agitation, behavioral problems, hallucinations and sleep disturbance. The patient is not nervous/anxious.     Health Maintenance  Topic Date Due  . TETANUS/TDAP  10/18/1944  . INFLUENZA VACCINE  Completed  . PNA vac Low Risk Adult  Completed    Physical Exam: Vitals:   08/11/19 1423  BP: (!) 146/90  Pulse: 83  Temp: 97.7 F (36.5 C)  SpO2: 94%  Weight: 204 lb 6.4 oz (92.7 kg)  Height: 5\' 10"  (1.778 m)   Body mass index is 29.33 kg/m. Physical Exam Vitals and nursing note reviewed.  Constitutional:      General: He is not in acute distress.    Appearance: Normal appearance. He is not ill-appearing, toxic-appearing or diaphoretic.  HENT:     Head: Normocephalic and atraumatic.     Nose: Nose normal.     Mouth/Throat:     Mouth: Mucous membranes are moist.  Eyes:     General:        Right eye: No discharge.        Left eye: No discharge.     Extraocular Movements: Extraocular movements intact.     Conjunctiva/sclera: Conjunctivae normal.  Cardiovascular:     Rate and Rhythm: Normal rate and regular rhythm.     Heart sounds: No murmur.  Pulmonary:     Breath sounds: No wheezing, rhonchi or rales.  Abdominal:     General: Bowel sounds are normal. There is no distension.     Palpations: Abdomen is soft.     Tenderness: There is no abdominal tenderness. There is no right CVA tenderness, left CVA tenderness, guarding or rebound.  Genitourinary:     Comments: A small marble sized rectal polyp at 7pm, no pain or injury.  Musculoskeletal:     Cervical back: Normal range of motion and neck supple.     Right lower leg: Edema present.     Left lower leg: Edema present.     Comments: Trace edema BLE  Skin:    General: Skin is warm and dry.  Neurological:     General: No focal deficit present.     Mental Status: He is alert and oriented to person, place, and time. Mental status is at baseline.     Motor: No weakness.     Coordination: Coordination normal.  Gait: Gait abnormal.  Psychiatric:        Mood and Affect: Mood normal.        Behavior: Behavior normal.        Thought Content: Thought content normal.        Judgment: Judgment normal.     Labs reviewed: Basic Metabolic Panel: Recent Labs    12/01/18 0800 05/28/19 0750  NA 142 141  K 4.5 4.3  CL 107 108  CO2 27 24  GLUCOSE 103* 107*  BUN 23 21  CREATININE 1.08 1.07  CALCIUM 9.0 9.2   Liver Function Tests: Recent Labs    12/01/18 0800 05/28/19 0750  AST 16 16  ALT 18 15  BILITOT 0.5 0.5  PROT 6.3 6.4   No results for input(s): LIPASE, AMYLASE in the last 8760 hours. No results for input(s): AMMONIA in the last 8760 hours. CBC: Recent Labs    12/01/18 0800  WBC 9.2  NEUTROABS 5,014  HGB 14.2  HCT 42.0  MCV 94.6  PLT 204   Lipid Panel: Recent Labs    12/01/18 0800 05/28/19 0750  CHOL 156 154  HDL 42 42  LDLCALC 85 87  TRIG 195* 158*  CHOLHDL 3.7 3.7   Lab Results  Component Value Date   HGBA1C 6.2 (H) 05/28/2019    Procedures since last visit: No results found.  Assessment/Plan Rectal polyp Avoid constipation, topical steroid agent for comfort, surgeon referral is desires.   BP (high blood pressure) Blood pressure is controlled, continue Metoprolol.   Venous (peripheral) insufficiency Prn Torsemide.   Anxiety disorder Continue Lorazepam.     Labs/tests ordered: none  Next appt:  11/26/2019

## 2019-08-11 NOTE — Assessment & Plan Note (Signed)
Blood pressure is controlled, continue Metoprolol. 

## 2019-08-11 NOTE — Assessment & Plan Note (Signed)
Avoid constipation, topical steroid agent for comfort, surgeon referral is desires.

## 2019-08-11 NOTE — Assessment & Plan Note (Signed)
Continue Lorazepam

## 2019-08-11 NOTE — Patient Instructions (Addendum)
Protruded a rectal polyp about a small marble sized, no injury or ulceration. Will refer to surgeon for evaluation and treatment.

## 2019-08-12 ENCOUNTER — Encounter: Payer: Self-pay | Admitting: Nurse Practitioner

## 2019-11-09 ENCOUNTER — Other Ambulatory Visit: Payer: Self-pay | Admitting: Internal Medicine

## 2019-11-26 ENCOUNTER — Other Ambulatory Visit: Payer: Self-pay

## 2019-11-26 DIAGNOSIS — E559 Vitamin D deficiency, unspecified: Secondary | ICD-10-CM

## 2019-11-26 DIAGNOSIS — E7849 Other hyperlipidemia: Secondary | ICD-10-CM

## 2019-11-26 DIAGNOSIS — R7303 Prediabetes: Secondary | ICD-10-CM

## 2019-11-27 LAB — CBC WITH DIFFERENTIAL/PLATELET
Absolute Monocytes: 799 cells/uL (ref 200–950)
Basophils Absolute: 47 cells/uL (ref 0–200)
Basophils Relative: 0.5 %
Eosinophils Absolute: 291 cells/uL (ref 15–500)
Eosinophils Relative: 3.1 %
HCT: 44.1 % (ref 38.5–50.0)
Hemoglobin: 14.9 g/dL (ref 13.2–17.1)
Lymphs Abs: 2905 cells/uL (ref 850–3900)
MCH: 31.8 pg (ref 27.0–33.0)
MCHC: 33.8 g/dL (ref 32.0–36.0)
MCV: 94.2 fL (ref 80.0–100.0)
MPV: 11.5 fL (ref 7.5–12.5)
Monocytes Relative: 8.5 %
Neutro Abs: 5358 cells/uL (ref 1500–7800)
Neutrophils Relative %: 57 %
Platelets: 198 10*3/uL (ref 140–400)
RBC: 4.68 10*6/uL (ref 4.20–5.80)
RDW: 12.1 % (ref 11.0–15.0)
Total Lymphocyte: 30.9 %
WBC: 9.4 10*3/uL (ref 3.8–10.8)

## 2019-11-27 LAB — HEMOGLOBIN A1C
Hgb A1c MFr Bld: 6.3 % of total Hgb — ABNORMAL HIGH (ref <5.7)
Mean Plasma Glucose: 134 (calc)
eAG (mmol/L): 7.4 (calc)

## 2019-11-27 LAB — VITAMIN D 25 HYDROXY (VIT D DEFICIENCY, FRACTURES): Vit D, 25-Hydroxy: 36 ng/mL (ref 30–100)

## 2019-12-02 ENCOUNTER — Encounter: Payer: Self-pay | Admitting: Internal Medicine

## 2019-12-02 ENCOUNTER — Non-Acute Institutional Stay: Payer: Medicare HMO | Admitting: Internal Medicine

## 2019-12-02 ENCOUNTER — Other Ambulatory Visit: Payer: Self-pay

## 2019-12-02 VITALS — BP 156/98 | HR 80 | Temp 97.8°F | Ht 70.0 in | Wt 204.4 lb

## 2019-12-02 DIAGNOSIS — F419 Anxiety disorder, unspecified: Secondary | ICD-10-CM

## 2019-12-02 DIAGNOSIS — I10 Essential (primary) hypertension: Secondary | ICD-10-CM | POA: Diagnosis not present

## 2019-12-02 DIAGNOSIS — J302 Other seasonal allergic rhinitis: Secondary | ICD-10-CM

## 2019-12-02 DIAGNOSIS — K621 Rectal polyp: Secondary | ICD-10-CM

## 2019-12-02 DIAGNOSIS — E559 Vitamin D deficiency, unspecified: Secondary | ICD-10-CM

## 2019-12-02 DIAGNOSIS — R7303 Prediabetes: Secondary | ICD-10-CM

## 2019-12-02 DIAGNOSIS — E785 Hyperlipidemia, unspecified: Secondary | ICD-10-CM

## 2019-12-02 MED ORDER — LORAZEPAM 1 MG PO TABS: 1.0000 mg | ORAL_TABLET | Freq: Every day | ORAL | 1 refills | Status: DC

## 2019-12-02 MED ORDER — TETANUS-DIPHTH-ACELL PERTUSSIS 5-2.5-18.5 LF-MCG/0.5 IM SUSP: 0.5000 mL | Freq: Once | INTRAMUSCULAR | 0 refills | Status: AC

## 2019-12-02 MED ORDER — TRIAMCINOLONE ACETONIDE 0.1 % EX CREA: 1.0000 "application " | TOPICAL_CREAM | CUTANEOUS | 0 refills | Status: DC | PRN

## 2019-12-02 NOTE — Patient Instructions (Signed)
Start loratadine 10 mg once daily for allergies.

## 2019-12-02 NOTE — Progress Notes (Signed)
Location:  [facility] Service:  Clinic (12)  Provider:   Code Status: Goals of Care:  Advanced Directives 06/03/2018  Does Patient Have a Medical Advance Directive? Yes  Type of Estate agent of [place];Living will;Out of facility DNR (pink MOST or yellow form)  Does patient want to make changes to medical advance directive? No - Patient declined  Copy of Healthcare Power of Attorney in Chart? Yes - validated most recent copy scanned in chart (See row information)  Pre-existing out of facility DNR order (yellow form or pink MOST form) Yellow form placed in chart (order not valid for inpatient use);Pink MOST form placed in chart (order not valid for inpatient use)     Chief Complaint  Patient presents with  . Medical Management of Chronic Issues    Patient returns to the clinic for follow up. He had his polyp removed. He has recently seen a dentist and dermatologist.   . Health Maintenance    TDAP    HPI: Patient is a 86 y.o. male seen today for medical management of chronic diseases.   Has h/o Hypertension, hyperlipidemia, lower extremity edema, CAD s/p CABG, anxiety with insomnia, allergic rhinitis,  Essential hypertension Mildly high today but patient states he checks it every day  at home and its less than 140 Allergic rhinitis Patient has started taking Benadryl over-the-counter Lower extremity edema Weight is stable takes Demadex as needed Anxiety with insomnia States that he takes Ativan every day. Rectal polyp Was had surgery.  No problems since then  Is very active.  Still drives does not use cane or a walker.  Takes care of his IADLs.  Has a nephew in [place] who is the POA.    No falls   Past Medical History:  Diagnosis Date  . Anxiety   . AV block, 1st degree 08/06/07  . Back pain   . Cellulitis of lower extremity 01/11/2015  . Coccygodynia 01/31/10  . Coronary artery disease   . Diverticulosis 1980  . Dupuytren  contracture 08/06/07   4th finger left hand  . Essential hypertension, malignant   . Gastric ulcer 12/30/01  . Hearing loss 01/16/05  . Hyperhidrosis, focal, primary 08/06/07  . Hyperlipemia   . Insomnia   . Internal thrombosed hemorrhoids 04/30/01  . Keratosis, actinic 08/03/09  . Slowing of urinary stream 08/04/07  . Vitamin D deficiency 05/27/09    Past Surgical History:  Procedure Laterality Date  . CATARACT EXTRACTION W/ INTRAOCULAR LENS IMPLANT Right 1990   Dr. Emmit Pomfret  . CATARACT EXTRACTION W/ INTRAOCULAR LENS IMPLANT Left 1992   Dr. Emmit Pomfret  . COLONOSCOPY  2003   Diverticulosis  . CORONARY ARTERY BYPASS GRAFT  1998   [facility]  . RECTAL POLYPECTOMY  1971   Dr. Elesa Hacker    Allergies  Allergen Reactions  . Furosemide Itching  . Orange Juice [Orange Oil] Other (See Comments)    indegestion    Outpatient Encounter Medications as of 12/02/2019  Medication Sig  . aspirin 81 MG tablet Take 81 mg by mouth daily.  . Cholecalciferol (VITAMIN D-3) 1000 UNITS CAPS Take 2 capsules by mouth daily.   Marland Kitchen LORazepam (ATIVAN) 1 MG tablet Take 1 tablet (1 mg total) by mouth daily.  . potassium chloride (KLOR-CON) 10 MEQ tablet TAKE 1 TABLET DAILY AS NEEDED (TAKE WITH TORSEMIDE)  . Propylene Glycol (SYSTANE BALANCE) 0.6 % SOLN Apply 1 drop to eye as needed. Both eyes   .  rosuvastatin (CRESTOR) 10 MG tablet TAKE 1 TABLET DAILY TO LOWER CHOLESTEROL  . torsemide (DEMADEX) 20 MG tablet Take 0.5 tablets (10 mg total) by mouth as needed.  . triamcinolone cream (KENALOG) 0.1 % Apply 1 application topically as needed.  . [DISCONTINUED] metoprolol tartrate (LOPRESSOR) 50 MG tablet Take one tablet in the morning and take 1/2 tablet in the evening. (Patient taking differently: Take one tablet in the morning and take 1 tablet in the evening.)  . [DISCONTINUED] potassium chloride (K-DUR) 10 MEQ tablet Take 1 tablet (10 mEq total) by mouth daily as needed (take with torsemide).   No facility-administered  encounter medications on file as of 12/02/2019.    Review of Systems:  Review of Systems  Review of Systems  Constitutional: Negative for activity change, appetite change, chills, diaphoresis, fatigue and fever.  HENT: Negative for mouth sores, c/o Rhinorrhea and Sneezing   Respiratory: Negative for apnea, cough, chest tightness, shortness of breath and wheezing.   Cardiovascular: Negative for chest pain, palpitations and Positive for  leg swelling.  Gastrointestinal: Negative for abdominal distention, abdominal pain, constipation, diarrhea, nausea and vomiting.  Genitourinary: Negative for dysuria and frequency.  Musculoskeletal: Negative for arthralgias, joint swelling and myalgias.  Skin:Positive for rash.  Neurological: Negative for dizziness, syncope, weakness, light-headedness and numbness.  Psychiatric/Behavioral: Negative for behavioral problems, confusion and sleep disturbance.     Health Maintenance  Topic Date Due  . TETANUS/TDAP  Never done  . INFLUENZA VACCINE  01/24/2020  . COVID-19 Vaccine  Completed  . PNA vac Low Risk Adult  Completed    Physical Exam: Vitals:   12/02/19 1300  BP: (!) 156/98  Pulse: 80  Temp: 97.8 F (36.6 C)  SpO2: 94%  Weight: 204 lb 6.4 oz (92.7 kg)  Height: 5\' 10"  (1.778 m)   Body mass index is 29.33 kg/m. Physical Exam  Constitutional: Oriented to person, place, and time. Well-developed and well-nourished.  HENT:  Head: Normocephalic.  Mouth/Throat: Oropharynx is clear and moist.  Eyes: Pupils are equal, round, and reactive to light.  Neck: Neck supple. Ears had no wax  Cardiovascular: Normal rate and normal heart sounds.  No murmur heard. Pulmonary/Chest: Effort normal and breath sounds normal. No respiratory distress. No wheezes. He  has no rales.  Abdominal: Soft. Bowel sounds are normal. No distension. There is no tenderness. There is no rebound.  Musculoskeletal: Bilateral Edema Present.  Lymphadenopathy:  none Neurological: Alert and oriented to person, place, and time.  Stable gait Does not use Walker or cane Skin: Skin is warm and dry.  Psychiatric: Normal mood and affect. Behavior is normal. Thought content normal.    Labs reviewed: Basic Metabolic Panel: Recent Labs    05/28/19 0750  NA 141  K 4.3  CL 108  CO2 24  GLUCOSE 107*  BUN 21  CREATININE 1.07  CALCIUM 9.2   Liver Function Tests: Recent Labs    05/28/19 0750  AST 16  ALT 15  BILITOT 0.5  PROT 6.4   No results for input(s): LIPASE, AMYLASE in the last 8760 hours. No results for input(s): AMMONIA in the last 8760 hours. CBC: Recent Labs    11/26/19 0000  WBC 9.4  NEUTROABS 5,358  HGB 14.9  HCT 44.1  MCV 94.2  PLT 198   Lipid Panel: Recent Labs    05/28/19 0750  CHOL 154  HDL 42  LDLCALC 87  TRIG 158*  CHOLHDL 3.7   Lab Results  Component Value Date  HGBA1C 6.3 (H) 11/26/2019    Procedures since last visit: No results found.  Assessment/Plan 1. Anxiety disorder, unspecified type with Insomnia  - LORazepam (ATIVAN) 1 MG tablet; Take 1 tablet (1 mg total) by mouth daily.  Dispense: 90 tablet; Refill: 1 Side effects discussed  Rectal polyp Did well after surgery   Essential hypertension Mildily high today But stays good at home per patient No Change in his Meds  Prediabetes A1C in good level D/w him about following with Ophthalmologist Diet Control and exercise Vitamin D deficiency Good level Continue Vit D Hyperlipidemia, unspecified hyperlipidemia type LDL good level on Crestor Seasonal allergic rhinitis, unspecified trigger Discontinue Diphenhydramine  Take Loratadine 10 mg PRN Urticaria Triamcinolone PRN Edema, unspecified type He takes Demadex PRn   Labs/tests ordered:  * No order type specified * Next appt:  Visit date not found

## 2019-12-07 ENCOUNTER — Other Ambulatory Visit: Payer: Self-pay | Admitting: Internal Medicine

## 2019-12-07 NOTE — Telephone Encounter (Signed)
Spoke with patient and confirmed that he is taking medication as listed on medication list

## 2020-02-07 ENCOUNTER — Other Ambulatory Visit: Payer: Self-pay | Admitting: Internal Medicine

## 2020-02-08 NOTE — Telephone Encounter (Signed)
Pharmacy requested refill.  °Pended Rx and sent to Dr. Gupta for approval due to HIGH ALERT Warning.  °

## 2020-02-13 ENCOUNTER — Other Ambulatory Visit: Payer: Self-pay | Admitting: Internal Medicine

## 2020-02-13 DIAGNOSIS — E7849 Other hyperlipidemia: Secondary | ICD-10-CM

## 2020-03-22 ENCOUNTER — Telehealth: Payer: Self-pay | Admitting: *Deleted

## 2020-03-22 NOTE — Telephone Encounter (Signed)
Will take tomorrow to clinic for signature.

## 2020-03-22 NOTE — Telephone Encounter (Signed)
Received Prior Authorization from [employer] for prior authorization for Lorazepam.  Filled out form and placed in Dr. Urban Gibson box to review and sign.

## 2020-03-24 NOTE — Telephone Encounter (Signed)
Holly with [facility] called regarding patient's Prior Authorization for Lorazepam.  Prior Authorization was APPROVED 02/23/20-03/24/21. Case #[id number]

## 2020-05-17 ENCOUNTER — Other Ambulatory Visit: Payer: Self-pay | Admitting: Internal Medicine

## 2020-05-26 DIAGNOSIS — R7303 Prediabetes: Secondary | ICD-10-CM

## 2020-05-26 DIAGNOSIS — I10 Essential (primary) hypertension: Secondary | ICD-10-CM

## 2020-05-26 DIAGNOSIS — E785 Hyperlipidemia, unspecified: Secondary | ICD-10-CM

## 2020-05-27 LAB — CBC WITH DIFFERENTIAL/PLATELET
Absolute Monocytes: 822 cells/uL (ref 200–950)
Basophils Absolute: 91 cells/uL (ref 0–200)
Basophils Relative: 1.1 %
Eosinophils Absolute: 307 cells/uL (ref 15–500)
Eosinophils Relative: 3.7 %
HCT: 41.6 % (ref 38.5–50.0)
Hemoglobin: 14.1 g/dL (ref 13.2–17.1)
Lymphs Abs: 2714 cells/uL (ref 850–3900)
MCH: 32.5 pg (ref 27.0–33.0)
MCHC: 33.9 g/dL (ref 32.0–36.0)
MCV: 95.9 fL (ref 80.0–100.0)
MPV: 12.1 fL (ref 7.5–12.5)
Monocytes Relative: 9.9 %
Neutro Abs: 4366 cells/uL (ref 1500–7800)
Neutrophils Relative %: 52.6 %
Platelets: 176 10*3/uL (ref 140–400)
RBC: 4.34 10*6/uL (ref 4.20–5.80)
RDW: 11.7 % (ref 11.0–15.0)
Total Lymphocyte: 32.7 %
WBC: 8.3 10*3/uL (ref 3.8–10.8)

## 2020-05-27 LAB — LIPID PANEL
Cholesterol: 160 mg/dL (ref <200)
HDL: 49 mg/dL (ref >=40)
LDL Cholesterol (Calc): 86 mg/dL (calc)
Non-HDL Cholesterol (Calc): 111 mg/dL (calc) (ref <130)
Total CHOL/HDL Ratio: 3.3 (calc) (ref <5.0)
Triglycerides: 150 mg/dL — ABNORMAL HIGH (ref <150)

## 2020-05-27 LAB — HEMOGLOBIN A1C
Hgb A1c MFr Bld: 6.4 % of total Hgb — ABNORMAL HIGH (ref <5.7)
Mean Plasma Glucose: 137 (calc)
eAG (mmol/L): 7.6 (calc)

## 2020-05-27 LAB — COMPLETE METABOLIC PANEL WITH GFR
AG Ratio: 1.7 (calc) (ref 1.0–2.5)
ALT: 19 U/L (ref 9–46)
AST: 23 U/L (ref 10–35)
Albumin: 4 g/dL (ref 3.6–5.1)
Alkaline phosphatase (APISO): 73 U/L (ref 35–144)
BUN/Creatinine Ratio: 18 (calc) (ref 6–22)
BUN: 22 mg/dL (ref 7–25)
CO2: 20 mmol/L (ref 20–32)
Calcium: 9.2 mg/dL (ref 8.6–10.3)
Chloride: 109 mmol/L (ref 98–110)
Creat: 1.23 mg/dL — ABNORMAL HIGH (ref 0.70–1.11)
GFR, Est African American: 59 mL/min/{1.73_m2} — ABNORMAL LOW (ref >=60)
GFR, Est Non African American: 51 mL/min/{1.73_m2} — ABNORMAL LOW (ref >=60)
Globulin: 2.4 g/dL (calc) (ref 1.9–3.7)
Glucose, Bld: 100 mg/dL — ABNORMAL HIGH (ref 65–99)
Potassium: 4.5 mmol/L (ref 3.5–5.3)
Sodium: 144 mmol/L (ref 135–146)
Total Bilirubin: 0.4 mg/dL (ref 0.2–1.2)
Total Protein: 6.4 g/dL (ref 6.1–8.1)

## 2020-05-27 LAB — TSH: TSH: 3.11 mIU/L (ref 0.40–4.50)

## 2020-06-01 ENCOUNTER — Other Ambulatory Visit: Payer: Self-pay

## 2020-06-01 ENCOUNTER — Encounter: Payer: Self-pay | Admitting: Internal Medicine

## 2020-06-01 ENCOUNTER — Non-Acute Institutional Stay: Payer: Medicare HMO | Admitting: Internal Medicine

## 2020-06-01 VITALS — BP 168/108 | HR 75 | Temp 96.7°F | Ht 70.0 in | Wt 209.2 lb

## 2020-06-01 DIAGNOSIS — R7303 Prediabetes: Secondary | ICD-10-CM

## 2020-06-01 DIAGNOSIS — E7849 Other hyperlipidemia: Secondary | ICD-10-CM

## 2020-06-01 DIAGNOSIS — F419 Anxiety disorder, unspecified: Secondary | ICD-10-CM | POA: Diagnosis not present

## 2020-06-01 DIAGNOSIS — I10 Essential (primary) hypertension: Secondary | ICD-10-CM | POA: Diagnosis not present

## 2020-06-01 DIAGNOSIS — N1831 Chronic kidney disease, stage 3a: Secondary | ICD-10-CM

## 2020-06-01 DIAGNOSIS — I25810 Atherosclerosis of coronary artery bypass graft(s) without angina pectoris: Secondary | ICD-10-CM

## 2020-06-01 DIAGNOSIS — E785 Hyperlipidemia, unspecified: Secondary | ICD-10-CM

## 2020-06-01 MED ORDER — CETIRIZINE HCL 10 MG PO TABS
10.0000 mg | ORAL_TABLET | Freq: Every day | ORAL | 1 refills | Status: DC
Start: 2020-06-01 — End: 2020-06-02

## 2020-06-01 MED ORDER — TORSEMIDE 20 MG PO TABS: ORAL_TABLET | ORAL | 1 refills | Status: DC

## 2020-06-01 MED ORDER — LORAZEPAM 1 MG PO TABS: 1.0000 mg | ORAL_TABLET | Freq: Every day | ORAL | 1 refills | Status: DC

## 2020-06-01 MED ORDER — METOPROLOL TARTRATE 50 MG PO TABS
50.0000 mg | ORAL_TABLET | Freq: Two times a day (BID) | ORAL | 1 refills | Status: DC
Start: 2020-06-01 — End: 2020-06-02

## 2020-06-01 MED ORDER — FAMOTIDINE 20 MG PO TABS
20.0000 mg | ORAL_TABLET | Freq: Every day | ORAL | 1 refills | Status: DC
Start: 2020-06-01 — End: 2020-06-02

## 2020-06-01 MED ORDER — ROSUVASTATIN CALCIUM 10 MG PO TABS: ORAL_TABLET | ORAL | 3 refills | Status: DC

## 2020-06-01 MED ORDER — POTASSIUM CHLORIDE CRYS ER 10 MEQ PO TBCR
EXTENDED_RELEASE_TABLET | ORAL | 3 refills | Status: DC
Start: 2020-06-01 — End: 2020-06-02

## 2020-06-01 MED ORDER — SYSTANE BALANCE 0.6 % OP SOLN: 1.0000 [drp] | OPHTHALMIC | 1 refills | Status: DC | PRN

## 2020-06-01 MED ORDER — TRIAMCINOLONE ACETONIDE 0.1 % EX CREA
1.0000 | TOPICAL_CREAM | CUTANEOUS | 0 refills | Status: DC | PRN
Start: 2020-06-01 — End: 2020-06-02

## 2020-06-01 NOTE — Progress Notes (Signed)
[facility]  [facility]  [facility] (12)  Provider:   Code Status:  Goals of Care:  Advanced Directives 06/03/2018  Does Patient Have a Medical Advance Directive? Yes  Type of Estate agent of [place];Living will;Out of facility DNR (pink MOST or yellow form)  Does patient want to make changes to medical advance directive? No - Patient declined  Copy of Healthcare Power of Attorney in Chart? Yes - validated most recent copy scanned in chart (See row information)  Pre-existing out of facility DNR order (yellow form or pink MOST form) Yellow form placed in chart (order not valid for inpatient use);Pink MOST form placed in chart (order not valid for inpatient use)     Chief Complaint  Patient presents with  . Medical Management of Chronic Issues    Patient returns to the clinic for his 6 month follow up and medication refills.     HPI: Patient is a 86 y.o. male seen today for medical management of chronic diseases.    Has h/o Hypertension, hyperlipidemia, lower extremity edema, CAD s/p CABG, anxiety with insomnia, allergic rhinitis,  Essential hypertension Blood pressure continues to run slightly high Patient says that he checks that in his home and is always less than 150/90.  He is not having any symptoms and does not want aggressive treatment Allergic rhinitis Is taking Claritin over-the-counter Lower extremity edema Continues to take Demadex as needed  Is very active.  Still drives does not use cane or a walker.  Takes care of his IADLs.  Has a nephew in [place] who is the POA.    No falls  Past Medical History:  Diagnosis Date  . Anxiety   . AV block, 1st degree 08/06/07  . Back pain   . Cellulitis of lower extremity 01/11/2015  . Coccygodynia 01/31/10  . Coronary artery disease   . Diverticulosis 1980  . Dupuytren contracture 08/06/07   4th finger left hand  . Essential hypertension, malignant   . Gastric ulcer 12/30/01  .  Hearing loss 01/16/05  . Hyperhidrosis, focal, primary 08/06/07  . Hyperlipemia   . Insomnia   . Internal thrombosed hemorrhoids 04/30/01  . Keratosis, actinic 08/03/09  . Slowing of urinary stream 08/04/07  . Vitamin D deficiency 05/27/09    Past Surgical History:  Procedure Laterality Date  . CATARACT EXTRACTION W/ INTRAOCULAR LENS IMPLANT Right 1990   Dr. Emmit Pomfret  . CATARACT EXTRACTION W/ INTRAOCULAR LENS IMPLANT Left 1992   Dr. Emmit Pomfret  . COLONOSCOPY  2003   Diverticulosis  . CORONARY ARTERY BYPASS GRAFT  1998   [facility]  . RECTAL POLYPECTOMY  1971   Dr. Elesa Hacker    Allergies  Allergen Reactions  . Furosemide Itching  . Orange Juice [Orange Oil] Other (See Comments)    indegestion    Outpatient Encounter Medications as of 06/01/2020  Medication Sig  . aspirin 81 MG tablet Take 81 mg by mouth daily.  . Cholecalciferol (VITAMIN D-3) 1000 UNITS CAPS Take 2 capsules by mouth daily.   Marland Kitchen LORazepam (ATIVAN) 1 MG tablet Take 1 tablet (1 mg total) by mouth daily.  . metoprolol tartrate (LOPRESSOR) 50 MG tablet Take 1 tablet (50 mg total) by mouth 2 (two) times daily.  . potassium chloride (KLOR-CON) 10 MEQ tablet TAKE 1 TABLET DAILY AS NEEDED (TAKE WITH TORSEMIDE)  . Propylene Glycol (SYSTANE BALANCE) 0.6 % SOLN Apply 1 drop to eye as needed. Both eyes   .  rosuvastatin (CRESTOR) 10 MG tablet TAKE 1 TABLET DAILY TO LOWER CHOLESTEROL (THIS PRESCRIPTION IS FOR WHEN PRESENT SCRIPT RUNS OUT)  . torsemide (DEMADEX) 20 MG tablet TAKE ONE-HALF (1/2) TABLET (10 MG TOTAL) AS NEEDED  . triamcinolone cream (KENALOG) 0.1 % Apply 1 application topically as needed.  . [DISCONTINUED] potassium chloride (K-DUR) 10 MEQ tablet Take 1 tablet (10 mEq total) by mouth daily as needed (take with torsemide).   No facility-administered encounter medications on file as of 06/01/2020.    Review of Systems:  Review of Systems  Review of Systems  Constitutional: Negative for activity change, appetite change,  chills, diaphoresis, fatigue and fever.  HENT: Negative for mouth sores, postnasal drip, rhinorrhea, sinus pain and sore throat.   Respiratory: Negative for apnea, cough, chest tightness, shortness of breath and wheezing.   Cardiovascular: Negative for chest pain, palpitations and leg swelling.  Gastrointestinal: Negative for abdominal distention, abdominal pain, constipation, diarrhea, nausea and vomiting.  Genitourinary: Negative for dysuria and frequency.  Musculoskeletal: Negative for arthralgias, joint swelling and myalgias.  Skin: Negative for rash.  Neurological: Negative for dizziness, syncope, weakness, light-headedness and numbness.  Psychiatric/Behavioral: Negative for behavioral problems, confusion     Health Maintenance  Topic Date Due  . TETANUS/TDAP  12/01/2029  . INFLUENZA VACCINE  Completed  . COVID-19 Vaccine  Completed  . PNA vac Low Risk Adult  Completed    Physical Exam: Vitals:   06/01/20 1308  BP: (!) 168/108  Pulse: 75  Temp: (!) 96.7 F (35.9 C)  SpO2: 98%  Weight: 209 lb 3.2 oz (94.9 kg)  Height: 5\' 10"  (1.778 m)   Body mass index is 30.02 kg/m. Physical Exam Constitutional: Oriented to person, place, and time. Well-developed and well-nourished.  HENT:  Head: Normocephalic.  Mouth/Throat: Oropharynx is clear and moist.  Eyes: Pupils are equal, round, and reactive to light.  Neck: Neck supple.  Cardiovascular: Normal rate and normal heart sounds.  No murmur heard. Pulmonary/Chest: Effort normal and breath sounds normal. No respiratory distress. No wheezes. Has no rales.  Abdominal: Soft. Bowel sounds are normal. No distension. There is no tenderness. There is no rebound.  Musculoskeletal: Edema Bilateral.  Lymphadenopathy: none Neurological: Alert and oriented to person, place, and time.  Skin: Skin is warm and dry.  Psychiatric: Normal mood and affect. Behavior is normal. Thought content normal.   Labs reviewed: Basic Metabolic  Panel: Recent Labs    05/25/20 1221  NA 144  K 4.5  CL 109  CO2 20  GLUCOSE 100*  BUN 22  CREATININE 1.23*  CALCIUM 9.2  TSH 3.11   Liver Function Tests: Recent Labs    05/25/20 1221  AST 23  ALT 19  BILITOT 0.4  PROT 6.4   No results for input(s): LIPASE, AMYLASE in the last 8760 hours. No results for input(s): AMMONIA in the last 8760 hours. CBC: Recent Labs    11/26/19 0000 05/25/20 1221  WBC 9.4 8.3  NEUTROABS 5,358 4,366  HGB 14.9 14.1  HCT 44.1 41.6  MCV 94.2 95.9  PLT 198 176   Lipid Panel: Recent Labs    05/25/20 1221  CHOL 160  HDL 49  LDLCALC 86  TRIG 150*  CHOLHDL 3.3   Lab Results  Component Value Date   HGBA1C 6.4 (H) 05/25/2020    Procedures since last visit: No results found.  Assessment/Plan  Other hyperlipidemia LDL less then 100 - rosuvastatin (CRESTOR) 10 MG tablet; TAKE 1 TABLET DAILY TO LOWER CHOLESTEROL (THIS  PRESCRIPTION IS FOR WHEN PRESENT SCRIPT RUNS OUT)  Dispense: 90 tablet; Refill: 3   Anxiety disorder, unspecified type  - LORazepam (ATIVAN) 1 MG tablet; Take 1 tablet (1 mg total) by mouth daily.  Dispense: 90 tablet; Refill: 1  Prediabetes A1C good range  Essential hypertension Blood pressure elevated checked twice today discussed with the patient of starting a new medication he refuses at this time   CAD History of coronary artery disease s/p CABG Continue on beta-blocker aspirin and statin  Edema Takes Demadex as needed CKD stage 3 Continue to monitor Urticaria Triamcinolone PRN Seasonal allergic rhinitis, unspecified trigger Discontinue Diphenhydramine  Take Loratadine 10 mg PRN  Labs/tests ordered:  * No order type specified * Next appt:  Visit date not found

## 2020-06-02 ENCOUNTER — Telehealth: Payer: Self-pay

## 2020-06-02 DIAGNOSIS — F419 Anxiety disorder, unspecified: Secondary | ICD-10-CM

## 2020-06-02 DIAGNOSIS — E7849 Other hyperlipidemia: Secondary | ICD-10-CM

## 2020-06-02 NOTE — Telephone Encounter (Signed)
Incoming call received from patient stating he is frustrated with our office. Patient states he seen Dr.Gupta yesterday and she sent his rx's to the wrong pharmacy [facility]).  Patient states Dr.Gupta nor her assistant confirmed where to send rx's so he assumed they knew. Patient went on to mention he has been a patient for a long time, since Dr.Green was here and we need to get it together.  Patient asked that we send all his Rx's to [facility]. Patient only uses [facility] for short-term medications.  I called [employer] and spoke with [place]. I asked [place] to void all 9 rx's sent to them yesterday as they were sent in error.

## 2020-06-02 NOTE — Telephone Encounter (Signed)
DW/ Rexene Agent CMA

## 2020-06-03 MED ORDER — TORSEMIDE 20 MG PO TABS
ORAL_TABLET | ORAL | 3 refills | Status: DC
Start: 2020-06-03 — End: 2020-07-20

## 2020-06-03 MED ORDER — SYSTANE BALANCE 0.6 % OP SOLN
1.0000 [drp] | OPHTHALMIC | 1 refills | Status: DC | PRN
Start: 2020-06-03 — End: 2020-07-20

## 2020-06-03 MED ORDER — METOPROLOL TARTRATE 50 MG PO TABS
50.0000 mg | ORAL_TABLET | Freq: Two times a day (BID) | ORAL | 3 refills | Status: DC
Start: 2020-06-03 — End: 2020-07-20

## 2020-06-03 MED ORDER — LORAZEPAM 1 MG PO TABS: 1.0000 mg | ORAL_TABLET | Freq: Every day | ORAL | 1 refills | Status: DC

## 2020-06-03 MED ORDER — CETIRIZINE HCL 10 MG PO TABS
10.0000 mg | ORAL_TABLET | Freq: Every day | ORAL | 3 refills | Status: DC
Start: 2020-06-03 — End: 2020-07-20

## 2020-06-03 MED ORDER — TRIAMCINOLONE ACETONIDE 0.1 % EX CREA
1.0000 | TOPICAL_CREAM | CUTANEOUS | 3 refills | Status: DC | PRN
Start: 2020-06-03 — End: 2020-07-20

## 2020-06-03 MED ORDER — FAMOTIDINE 20 MG PO TABS
20.0000 mg | ORAL_TABLET | Freq: Every day | ORAL | 3 refills | Status: DC
Start: 2020-06-03 — End: 2020-07-20

## 2020-06-03 MED ORDER — POTASSIUM CHLORIDE CRYS ER 10 MEQ PO TBCR: EXTENDED_RELEASE_TABLET | ORAL | 3 refills | Status: DC

## 2020-06-03 MED ORDER — ROSUVASTATIN CALCIUM 10 MG PO TABS: ORAL_TABLET | ORAL | 3 refills | Status: DC

## 2020-06-03 NOTE — Telephone Encounter (Signed)
I signed it. Please make sure it was done right . Thanks

## 2020-06-03 NOTE — Telephone Encounter (Signed)
RX's are still awaiting approval from provider.

## 2020-06-06 NOTE — Telephone Encounter (Signed)
E-Prescribing Status: Receipt confirmed by pharmacy (06/03/2020 8:38 PM EST)

## 2020-07-20 ENCOUNTER — Telehealth: Payer: Self-pay

## 2020-07-20 DIAGNOSIS — E7849 Other hyperlipidemia: Secondary | ICD-10-CM

## 2020-07-20 DIAGNOSIS — F419 Anxiety disorder, unspecified: Secondary | ICD-10-CM

## 2020-07-20 MED ORDER — METOPROLOL TARTRATE 50 MG PO TABS
50.0000 mg | ORAL_TABLET | Freq: Two times a day (BID) | ORAL | 3 refills | Status: DC
Start: 2020-07-20 — End: 2021-07-13

## 2020-07-20 MED ORDER — TORSEMIDE 20 MG PO TABS: ORAL_TABLET | ORAL | 3 refills | Status: DC

## 2020-07-20 MED ORDER — LORAZEPAM 1 MG PO TABS: 1.0000 mg | ORAL_TABLET | Freq: Every day | ORAL | 1 refills | Status: DC

## 2020-07-20 MED ORDER — ROSUVASTATIN CALCIUM 10 MG PO TABS: ORAL_TABLET | ORAL | 3 refills | Status: DC

## 2020-07-20 MED ORDER — POTASSIUM CHLORIDE CRYS ER 10 MEQ PO TBCR
EXTENDED_RELEASE_TABLET | ORAL | 3 refills | Status: DC
Start: 2020-07-20 — End: 2021-07-10

## 2020-07-20 MED ORDER — CETIRIZINE HCL 10 MG PO TABS
10.0000 mg | ORAL_TABLET | Freq: Every day | ORAL | 3 refills | Status: DC
Start: 2020-07-20 — End: 2021-08-15

## 2020-07-20 MED ORDER — FAMOTIDINE 20 MG PO TABS
20.0000 mg | ORAL_TABLET | Freq: Every day | ORAL | 3 refills | Status: DC
Start: 2020-07-20 — End: 2021-08-15

## 2020-07-20 MED ORDER — SYSTANE BALANCE 0.6 % OP SOLN: 1.0000 [drp] | OPHTHALMIC | 1 refills | Status: DC | PRN

## 2020-07-20 MED ORDER — TRIAMCINOLONE ACETONIDE 0.1 % EX CREA: 1.0000 "application " | TOPICAL_CREAM | CUTANEOUS | 3 refills | Status: DC | PRN

## 2020-07-20 NOTE — Telephone Encounter (Signed)
Patient want to ensure his prescriptions go to [facility] unless its a one time prescription.   Every thing was sent to [facility].

## 2020-07-26 ENCOUNTER — Other Ambulatory Visit: Payer: Self-pay

## 2020-12-01 ENCOUNTER — Other Ambulatory Visit: Payer: Self-pay

## 2020-12-01 DIAGNOSIS — E7849 Other hyperlipidemia: Secondary | ICD-10-CM

## 2020-12-01 DIAGNOSIS — I10 Essential (primary) hypertension: Secondary | ICD-10-CM

## 2020-12-01 DIAGNOSIS — R7303 Prediabetes: Secondary | ICD-10-CM

## 2020-12-02 LAB — COMPLETE METABOLIC PANEL WITH GFR
AG Ratio: 1.6 (calc) (ref 1.0–2.5)
ALT: 31 U/L (ref 9–46)
AST: 30 U/L (ref 10–35)
Albumin: 3.9 g/dL (ref 3.6–5.1)
Alkaline phosphatase (APISO): 81 U/L (ref 35–144)
BUN: 17 mg/dL (ref 7–25)
CO2: 26 mmol/L (ref 20–32)
Calcium: 9.3 mg/dL (ref 8.6–10.3)
Chloride: 105 mmol/L (ref 98–110)
Creat: 1.09 mg/dL (ref 0.70–1.11)
GFR, Est African American: 67 mL/min/{1.73_m2} (ref >=60)
GFR, Est Non African American: 58 mL/min/{1.73_m2} — ABNORMAL LOW (ref >=60)
Globulin: 2.4 g/dL (calc) (ref 1.9–3.7)
Glucose, Bld: 111 mg/dL — ABNORMAL HIGH (ref 65–99)
Potassium: 4.4 mmol/L (ref 3.5–5.3)
Sodium: 140 mmol/L (ref 135–146)
Total Bilirubin: 0.5 mg/dL (ref 0.2–1.2)
Total Protein: 6.3 g/dL (ref 6.1–8.1)

## 2020-12-02 LAB — CBC WITH DIFFERENTIAL/PLATELET
Absolute Monocytes: 694 cells/uL (ref 200–950)
Basophils Absolute: 73 cells/uL (ref 0–200)
Basophils Relative: 1 %
Eosinophils Absolute: 299 cells/uL (ref 15–500)
Eosinophils Relative: 4.1 %
HCT: 43.9 % (ref 38.5–50.0)
Hemoglobin: 15 g/dL (ref 13.2–17.1)
Lymphs Abs: 1978 cells/uL (ref 850–3900)
MCH: 32.4 pg (ref 27.0–33.0)
MCHC: 34.2 g/dL (ref 32.0–36.0)
MCV: 94.8 fL (ref 80.0–100.0)
MPV: 12 fL (ref 7.5–12.5)
Monocytes Relative: 9.5 %
Neutro Abs: 4256 cells/uL (ref 1500–7800)
Neutrophils Relative %: 58.3 %
Platelets: 209 10*3/uL (ref 140–400)
RBC: 4.63 10*6/uL (ref 4.20–5.80)
RDW: 11.8 % (ref 11.0–15.0)
Total Lymphocyte: 27.1 %
WBC: 7.3 10*3/uL (ref 3.8–10.8)

## 2020-12-02 LAB — LIPID PANEL
Cholesterol: 244 mg/dL — ABNORMAL HIGH (ref <200)
HDL: 42 mg/dL (ref >=40)
LDL Cholesterol (Calc): 161 mg/dL (calc) — ABNORMAL HIGH
Non-HDL Cholesterol (Calc): 202 mg/dL (calc) — ABNORMAL HIGH (ref <130)
Total CHOL/HDL Ratio: 5.8 (calc) — ABNORMAL HIGH (ref <5.0)
Triglycerides: 241 mg/dL — ABNORMAL HIGH (ref <150)

## 2020-12-02 LAB — HEMOGLOBIN A1C
Hgb A1c MFr Bld: 6.3 % of total Hgb — ABNORMAL HIGH (ref <5.7)
Mean Plasma Glucose: 134 mg/dL
eAG (mmol/L): 7.4 mmol/L

## 2020-12-07 ENCOUNTER — Non-Acute Institutional Stay: Payer: Medicare HMO | Admitting: Internal Medicine

## 2020-12-07 ENCOUNTER — Encounter: Payer: Self-pay | Admitting: Internal Medicine

## 2020-12-07 ENCOUNTER — Other Ambulatory Visit: Payer: Self-pay

## 2020-12-07 VITALS — BP 148/98 | HR 75 | Temp 96.3°F | Ht 70.0 in | Wt 205.4 lb

## 2020-12-07 DIAGNOSIS — L509 Urticaria, unspecified: Secondary | ICD-10-CM

## 2020-12-07 DIAGNOSIS — I25810 Atherosclerosis of coronary artery bypass graft(s) without angina pectoris: Secondary | ICD-10-CM | POA: Diagnosis not present

## 2020-12-07 DIAGNOSIS — E785 Hyperlipidemia, unspecified: Secondary | ICD-10-CM

## 2020-12-07 DIAGNOSIS — R7303 Prediabetes: Secondary | ICD-10-CM

## 2020-12-07 DIAGNOSIS — N1831 Chronic kidney disease, stage 3a: Secondary | ICD-10-CM

## 2020-12-07 DIAGNOSIS — F419 Anxiety disorder, unspecified: Secondary | ICD-10-CM

## 2020-12-07 DIAGNOSIS — I10 Essential (primary) hypertension: Secondary | ICD-10-CM

## 2020-12-07 MED ORDER — CLOBETASOL PROPIONATE 0.05 % EX CREA: 1.0000 "application " | TOPICAL_CREAM | Freq: Two times a day (BID) | CUTANEOUS | 0 refills | Status: DC

## 2020-12-07 NOTE — Progress Notes (Signed)
[facility]  [facility]  Clinic (12)  Provider:   Code Status:  Goals of Care:  Advanced Directives 06/03/2018  Does Patient Have a Medical Advance Directive? Yes  Type of Estate agent of [place];Living will;Out of facility DNR (pink MOST or yellow form)  Does patient want to make changes to medical advance directive? No - Patient declined  Copy of Healthcare Power of Attorney in Chart? Yes - validated most recent copy scanned in chart (See row information)  Pre-existing out of facility DNR order (yellow form or pink MOST form) Yellow form placed in chart (order not valid for inpatient use);Pink MOST form placed in chart (order not valid for inpatient use)     Chief Complaint  Patient presents with   Medical Management of Chronic Issues    Patient here today for follow up. He would like a prescription for Clobetasol Prop   Health Maintenance    #3 Covid vaccine, Shingrix    HPI: Patient is a 87 y.o. male seen today for medical management of chronic diseases.    Has h/o Hypertension, hyperlipidemia, lower extremity edema, CAD s/p CABG, anxiety with insomnia, allergic rhinitis  BP runs slightly high but refuses more treatment  He stopped his Crestor recently as he was having issues with itching and he think it is now resolved when he stopped Crestor No Other issue NO Falls Walks with no assist Drives even went to [place] by himself Takes care of his IADLs.  Has a nephew in [place] who is the POA.     Past Medical History:  Diagnosis Date   Anxiety    AV block, 1st degree 08/06/07   Back pain    Cellulitis of lower extremity 01/11/2015   Coccygodynia 01/31/10   Coronary artery disease    Diverticulosis 1980   Dupuytren contracture 08/06/07   4th finger left hand   Essential hypertension, malignant    Gastric ulcer 12/30/01   Hearing loss 01/16/05   Hyperhidrosis, focal, primary 08/06/07   Hyperlipemia    Insomnia     Internal thrombosed hemorrhoids 04/30/01   Keratosis, actinic 08/03/09   Slowing of urinary stream 08/04/07   Vitamin D deficiency 05/27/09    Past Surgical History:  Procedure Laterality Date   CATARACT EXTRACTION W/ INTRAOCULAR LENS IMPLANT Right 1990   Dr. Emmit Pomfret   CATARACT EXTRACTION W/ INTRAOCULAR LENS IMPLANT Left 1992   Dr. Emmit Pomfret   COLONOSCOPY  2003   Diverticulosis   CORONARY ARTERY BYPASS GRAFT  1998   [facility]   RECTAL POLYPECTOMY  1971   Dr. Elesa Hacker    Allergies  Allergen Reactions   Furosemide Itching   Orange Juice [Orange Oil] Other (See Comments)    indegestion    Outpatient Encounter Medications as of 12/07/2020  Medication Sig   aspirin 81 MG tablet Take 81 mg by mouth daily.   cetirizine (ZYRTEC) 10 MG tablet Take 1 tablet (10 mg total) by mouth daily.   Cholecalciferol (VITAMIN D3) 50 MCG (2000 UT) TABS Take 1 tablet by mouth daily.   clobetasol cream (TEMOVATE) 0.05 % Apply 1 application topically 2 (two) times daily.   famotidine (PEPCID) 20 MG tablet Take 1 tablet (20 mg total) by mouth daily.   LORazepam (ATIVAN) 1 MG tablet Take 1 tablet (1 mg total) by mouth daily.   metoprolol tartrate (LOPRESSOR) 50 MG tablet Take 1 tablet (50 mg total) by mouth 2 (two) times  daily.   potassium chloride (KLOR-CON) 10 MEQ tablet TAKE 1 TABLET DAILY AS NEEDED (TAKE WITH TORSEMIDE)   Propylene Glycol (SYSTANE BALANCE) 0.6 % SOLN Apply 1 drop to eye as needed. Both eyes   rosuvastatin (CRESTOR) 10 MG tablet TAKE 1 TABLET DAILY TO LOWER CHOLESTEROL   torsemide (DEMADEX) 20 MG tablet TAKE ONE-HALF (1/2) TABLET (10 MG TOTAL) AS NEEDED   triamcinolone (KENALOG) 0.1 % Apply 1 application topically as needed.   [DISCONTINUED] Cholecalciferol (VITAMIN D-3) 1000 UNITS CAPS Take 2 capsules by mouth daily.    [DISCONTINUED] clobetasol cream (TEMOVATE) 0.05 % Apply 1 application topically 2 (two) times daily.   [DISCONTINUED] potassium chloride (K-DUR) 10 MEQ tablet Take 1 tablet  (10 mEq total) by mouth daily as needed (take with torsemide).   No facility-administered encounter medications on file as of 12/07/2020.    Review of Systems:  Review of Systems Review of Systems  Constitutional: Negative for activity change, appetite change, chills, diaphoresis, fatigue and fever.  HENT: Negative for mouth sores, postnasal drip, rhinorrhea, sinus pain and sore throat.   Respiratory: Negative for apnea, cough, chest tightness, shortness of breath and wheezing.   Cardiovascular: Negative for chest pain, palpitations and leg swelling.  Gastrointestinal: Negative for abdominal distention, abdominal pain, constipation, diarrhea, nausea and vomiting.  Genitourinary: Negative for dysuria and frequency.  Musculoskeletal: Negative for arthralgias, joint swelling and myalgias.  Skin: Negative for rash.  Neurological: Negative for dizziness, syncope, weakness, light-headedness and numbness.  Psychiatric/Behavioral: Negative for behavioral problems, confusion and sleep disturbance.   Health Maintenance  Topic Date Due   Zoster Vaccines- Shingrix (1 of 2) Never done   COVID-19 Vaccine (3 - Booster for Moderna series) 12/24/2019   INFLUENZA VACCINE  01/23/2021   TETANUS/TDAP  12/01/2029   PNA vac Low Risk Adult  Completed   HPV VACCINES  Aged Out    Physical Exam: Vitals:   12/07/20 1322  BP: (!) 148/98  Pulse: 75  Temp: (!) 96.3 F (35.7 C)  SpO2: 96%  Weight: 205 lb 6.4 oz (93.2 kg)  Height: 5\' 10"  (1.778 m)   Body mass index is 29.47 kg/m. Physical Exam Constitutional: Oriented to person, place, and time. Well-developed and well-nourished.  HENT:  Head: Normocephalic.  Mouth/Throat: Oropharynx is clear and moist.  Eyes: Pupils are equal, round, and reactive to light.  Neck: Neck supple.  Cardiovascular: Normal rate and normal heart sounds.  No murmur heard. Pulmonary/Chest: Effort normal and breath sounds normal. No respiratory distress. No wheezes. She has  no rales.  Abdominal: Soft. Bowel sounds are normal. No distension. There is no tenderness. There is no rebound.  Musculoskeletal: Bilateral Edema Left more then right  Lymphadenopathy: none Neurological: Alert and oriented to person, place, and time.  Gait stable Skin: Skin is warm and dry.  Psychiatric: Normal mood and affect. Behavior is normal. Thought content normal.   Labs reviewed: Basic Metabolic Panel: Recent Labs    05/25/20 1221 11/30/20 0858  NA 144 140  K 4.5 4.4  CL 109 105  CO2 20 26  GLUCOSE 100* 111*  BUN 22 17  CREATININE 1.23* 1.09  CALCIUM 9.2 9.3  TSH 3.11  --    Liver Function Tests: Recent Labs    05/25/20 1221 11/30/20 0858  AST 23 30  ALT 19 31  BILITOT 0.4 0.5  PROT 6.4 6.3   No results for input(s): LIPASE, AMYLASE in the last 8760 hours. No results for input(s): AMMONIA in the last 8760  hours. CBC: Recent Labs    05/25/20 1221 11/30/20 0858  WBC 8.3 7.3  NEUTROABS 4,366 4,256  HGB 14.1 15.0  HCT 41.6 43.9  MCV 95.9 94.8  PLT 176 209   Lipid Panel: Recent Labs    05/25/20 1221 11/30/20 0858  CHOL 160 244*  HDL 49 42  LDLCALC 86 161*  TRIG 150* 241*  CHOLHDL 3.3 5.8*   Lab Results  Component Value Date   HGBA1C 6.3 (H) 11/30/2020    Procedures since last visit: No results found.  Assessment/Plan 1. Prediabetes  - Hemoglobin A1c; Future  2. Essential hypertension  - CBC with Differential/Platelet; Future - COMPLETE METABOLIC PANEL WITH GFR; Future - TSH; Future  3. Hyperlipidemia, unspecified hyperlipidemia type Stopped Crestor due to ? Itching I have told him to try every other day and see if his itching comes back - Lipid panel; Future  4. Coronary artery disease involving coronary bypass graft of native heart without angina pectoris On Aspirin , Beta Blocker and restart   5. Stage 3a chronic kidney disease (HCC) Creat stable  6. Urticaria Clobetasol Prn  7. Anxiety disorder, unspecified type   Ativan   8 LE edema Uses Demadex prn 9 Hearing Loss Referal made to facility Auditory   Labs/tests ordered:  * No order type specified * Next appt:  06/01/2021

## 2021-02-09 ENCOUNTER — Other Ambulatory Visit: Payer: Self-pay | Admitting: Internal Medicine

## 2021-06-01 ENCOUNTER — Other Ambulatory Visit: Payer: Self-pay

## 2021-06-01 DIAGNOSIS — R7303 Prediabetes: Secondary | ICD-10-CM

## 2021-06-01 DIAGNOSIS — E785 Hyperlipidemia, unspecified: Secondary | ICD-10-CM

## 2021-06-01 DIAGNOSIS — I10 Essential (primary) hypertension: Secondary | ICD-10-CM

## 2021-06-02 LAB — CBC WITH DIFFERENTIAL/PLATELET
Absolute Monocytes: 859 cells/uL (ref 200–950)
Basophils Absolute: 77 cells/uL (ref 0–200)
Basophils Relative: 0.9 %
Eosinophils Absolute: 468 cells/uL (ref 15–500)
Eosinophils Relative: 5.5 %
HCT: 45.1 % (ref 38.5–50.0)
Hemoglobin: 15.3 g/dL (ref 13.2–17.1)
Lymphs Abs: 2278 cells/uL (ref 850–3900)
MCH: 32.8 pg (ref 27.0–33.0)
MCHC: 33.9 g/dL (ref 32.0–36.0)
MCV: 96.8 fL (ref 80.0–100.0)
MPV: 11.9 fL (ref 7.5–12.5)
Monocytes Relative: 10.1 %
Neutro Abs: 4820 cells/uL (ref 1500–7800)
Neutrophils Relative %: 56.7 %
Platelets: 187 10*3/uL (ref 140–400)
RBC: 4.66 10*6/uL (ref 4.20–5.80)
RDW: 11.7 % (ref 11.0–15.0)
Total Lymphocyte: 26.8 %
WBC: 8.5 10*3/uL (ref 3.8–10.8)

## 2021-06-02 LAB — COMPLETE METABOLIC PANEL WITH GFR
AG Ratio: 1.6 (calc) (ref 1.0–2.5)
ALT: 37 U/L (ref 9–46)
AST: 40 U/L — ABNORMAL HIGH (ref 10–35)
Albumin: 4.1 g/dL (ref 3.6–5.1)
Alkaline phosphatase (APISO): 73 U/L (ref 35–144)
BUN: 16 mg/dL (ref 7–25)
CO2: 25 mmol/L (ref 20–32)
Calcium: 9.2 mg/dL (ref 8.6–10.3)
Chloride: 105 mmol/L (ref 98–110)
Creat: 0.96 mg/dL (ref 0.70–1.22)
Globulin: 2.5 g/dL (calc) (ref 1.9–3.7)
Glucose, Bld: 92 mg/dL (ref 65–99)
Potassium: 4.2 mmol/L (ref 3.5–5.3)
Sodium: 142 mmol/L (ref 135–146)
Total Bilirubin: 0.5 mg/dL (ref 0.2–1.2)
Total Protein: 6.6 g/dL (ref 6.1–8.1)
eGFR: 74 mL/min/{1.73_m2} (ref >=60)

## 2021-06-02 LAB — LIPID PANEL
Cholesterol: 178 mg/dL (ref <200)
HDL: 47 mg/dL (ref >=40)
LDL Cholesterol (Calc): 99 mg/dL (calc)
Non-HDL Cholesterol (Calc): 131 mg/dL (calc) — ABNORMAL HIGH (ref <130)
Total CHOL/HDL Ratio: 3.8 (calc) (ref <5.0)
Triglycerides: 202 mg/dL — ABNORMAL HIGH (ref <150)

## 2021-06-02 LAB — TSH: TSH: 4.91 mIU/L — ABNORMAL HIGH (ref 0.40–4.50)

## 2021-06-02 LAB — HEMOGLOBIN A1C
Hgb A1c MFr Bld: 6.5 % of total Hgb — ABNORMAL HIGH (ref <5.7)
Mean Plasma Glucose: 140 mg/dL
eAG (mmol/L): 7.7 mmol/L

## 2021-06-07 ENCOUNTER — Non-Acute Institutional Stay: Payer: Medicare HMO | Admitting: Internal Medicine

## 2021-06-07 ENCOUNTER — Other Ambulatory Visit: Payer: Self-pay

## 2021-06-07 ENCOUNTER — Encounter: Payer: Self-pay | Admitting: Internal Medicine

## 2021-06-07 VITALS — BP 164/90 | HR 88 | Temp 97.5°F | Ht 70.0 in | Wt 205.3 lb

## 2021-06-07 DIAGNOSIS — E785 Hyperlipidemia, unspecified: Secondary | ICD-10-CM

## 2021-06-07 DIAGNOSIS — R7303 Prediabetes: Secondary | ICD-10-CM

## 2021-06-07 DIAGNOSIS — I25810 Atherosclerosis of coronary artery bypass graft(s) without angina pectoris: Secondary | ICD-10-CM | POA: Diagnosis not present

## 2021-06-07 DIAGNOSIS — N1831 Chronic kidney disease, stage 3a: Secondary | ICD-10-CM

## 2021-06-07 DIAGNOSIS — J302 Other seasonal allergic rhinitis: Secondary | ICD-10-CM

## 2021-06-07 DIAGNOSIS — I10 Essential (primary) hypertension: Secondary | ICD-10-CM

## 2021-06-07 DIAGNOSIS — L509 Urticaria, unspecified: Secondary | ICD-10-CM

## 2021-06-07 NOTE — Progress Notes (Signed)
[facility] (12)  Provider:   Code Status:  Goals of Care:  Advanced Directives 06/03/2018  Does Patient Have a Medical Advance Directive? Yes  Type of Estate agent of [place];Living will;Out of facility DNR (pink MOST or yellow form)  Does patient want to make changes to medical advance directive? No - Patient declined  Copy of Healthcare Power of Attorney in Chart? Yes - validated most recent copy scanned in chart (See row information)  Pre-existing out of facility DNR order (yellow form or pink MOST form) Yellow form placed in chart (order not valid for inpatient use);Pink MOST form placed in chart (order not valid for inpatient use)     Chief Complaint  Patient presents with   Medical Management of Chronic Issues    Patient returns to the clinic for his 6 month follow up.     HPI: Patient is a 86 y.o. male seen today for medical management of chronic diseases.    Has h/o Hypertension, hyperlipidemia, lower extremity edema, CAD s/p CABG, anxiety with insomnia, allergic rhinitis   BP runs slightly high but refuses more treatment  His Itching is better on every other day of Crestor No Acute issues No Falls Weight stable Walk with no assist Still drives.    Takes care of his IADLs.  Has a nephew in [place] who is the POA. Past Medical History:  Diagnosis Date   Anxiety    AV block, 1st degree 08/06/07   Back pain    Cellulitis of lower extremity 01/11/2015   Coccygodynia 01/31/10   Coronary artery disease    Diverticulosis 1980   Dupuytren contracture 08/06/07   4th finger left hand   Essential hypertension, malignant    Gastric ulcer 12/30/01   Hearing loss 01/16/05   Hyperhidrosis, focal, primary 08/06/07   Hyperlipemia    Insomnia    Internal thrombosed hemorrhoids 04/30/01   Keratosis, actinic 08/03/09   Slowing of urinary stream 08/04/07   Vitamin D deficiency 05/27/09    Past Surgical History:   Procedure Laterality Date   CATARACT EXTRACTION W/ INTRAOCULAR LENS IMPLANT Right 1990   Dr. Emmit Pomfret   CATARACT EXTRACTION W/ INTRAOCULAR LENS IMPLANT Left 1992   Dr. Emmit Pomfret   COLONOSCOPY  2003   Diverticulosis   CORONARY ARTERY BYPASS GRAFT  1998   [facility]   RECTAL POLYPECTOMY  1971   Dr. Elesa Hacker    Allergies  Allergen Reactions   Furosemide Itching   Orange Juice [Orange Oil] Other (See Comments)    indegestion    Outpatient Encounter Medications as of 06/07/2021  Medication Sig   aspirin 81 MG tablet Take 81 mg by mouth daily.   cetirizine (ZYRTEC) 10 MG tablet Take 1 tablet (10 mg total) by mouth daily.   Cholecalciferol (VITAMIN D3) 50 MCG (2000 UT) TABS Take 1 tablet by mouth daily.   clobetasol cream (TEMOVATE) 0.05 % Apply 1 application topically 2 (two) times daily.   famotidine (PEPCID) 20 MG tablet Take 1 tablet (20 mg total) by mouth daily.   metoprolol tartrate (LOPRESSOR) 50 MG tablet Take 1 tablet (50 mg total) by mouth 2 (two) times daily.   potassium chloride (KLOR-CON) 10 MEQ tablet TAKE 1 TABLET DAILY AS NEEDED (TAKE WITH TORSEMIDE)   Propylene Glycol (SYSTANE BALANCE) 0.6 % SOLN Apply 1 drop to eye as needed. Both eyes   rosuvastatin (CRESTOR) 10 MG tablet TAKE 1  TABLET DAILY TO LOWER CHOLESTEROL (Patient taking differently: every other day. TAKE 1 TABLET DAILY TO LOWER CHOLESTEROL)   torsemide (DEMADEX) 20 MG tablet TAKE ONE-HALF (1/2) TABLET (10 MG TOTAL) AS NEEDED   triamcinolone cream (KENALOG) 0.1 % APPLY 1 APPLICATION TOPICALLY AS NEEDED   [DISCONTINUED] LORazepam (ATIVAN) 1 MG tablet Take 1 tablet (1 mg total) by mouth daily.   [DISCONTINUED] potassium chloride (K-DUR) 10 MEQ tablet Take 1 tablet (10 mEq total) by mouth daily as needed (take with torsemide).   No facility-administered encounter medications on file as of 06/07/2021.    Review of Systems:  Review of Systems  Constitutional:  Negative for activity change, appetite change and  unexpected weight change.  HENT: Negative.    Respiratory:  Negative for cough and shortness of breath.   Cardiovascular:  Positive for leg swelling.  Gastrointestinal:  Negative for constipation.  Genitourinary:  Positive for frequency.  Musculoskeletal:  Negative for arthralgias, gait problem and myalgias.  Skin:  Positive for rash.  Neurological:  Negative for dizziness and weakness.  Psychiatric/Behavioral:  Negative for confusion and sleep disturbance.   All other systems reviewed and are negative.  Health Maintenance  Topic Date Due   Zoster Vaccines- Shingrix (1 of 2) Never done   COVID-19 Vaccine (3 - Booster for Moderna series) 09/21/2019   INFLUENZA VACCINE  01/23/2021   TETANUS/TDAP  12/01/2029   Pneumonia Vaccine 16+ Years old  Completed   HPV VACCINES  Aged Out    Physical Exam: Vitals:   06/07/21 1345  BP: (!) 164/90  Pulse: 88  Temp: (!) 97.5 F (36.4 C)  SpO2: 95%  Weight: 205 lb 4.8 oz (93.1 kg)  Height: 5\' 10"  (1.778 m)   Body mass index is 29.46 kg/m. Physical Exam Vitals reviewed.  Constitutional:      Appearance: Normal appearance.  HENT:     Head: Normocephalic.     Mouth/Throat:     Mouth: Mucous membranes are moist.     Pharynx: Oropharynx is clear.  Eyes:     Pupils: Pupils are equal, round, and reactive to light.  Cardiovascular:     Rate and Rhythm: Normal rate and regular rhythm.     Pulses: Normal pulses.     Heart sounds: No murmur heard. Pulmonary:     Effort: Pulmonary effort is normal. No respiratory distress.     Breath sounds: Normal breath sounds. No rales.  Abdominal:     General: Abdomen is flat. Bowel sounds are normal.     Palpations: Abdomen is soft.  Musculoskeletal:        General: Swelling present.     Cervical back: Neck supple.  Skin:    General: Skin is warm.  Neurological:     General: No focal deficit present.     Mental Status: He is alert and oriented to person, place, and time.  Psychiatric:         Mood and Affect: Mood normal.        Thought Content: Thought content normal.    Labs reviewed: Basic Metabolic Panel: Recent Labs    11/30/20 0858 06/01/21 0745  NA 140 142  K 4.4 4.2  CL 105 105  CO2 26 25  GLUCOSE 111* 92  BUN 17 16  CREATININE 1.09 0.96  CALCIUM 9.3 9.2  TSH  --  4.91*   Liver Function Tests: Recent Labs    11/30/20 0858 06/01/21 0745  AST 30 40*  ALT 31 37  BILITOT  0.5 0.5  PROT 6.3 6.6   No results for input(s): LIPASE, AMYLASE in the last 8760 hours. No results for input(s): AMMONIA in the last 8760 hours. CBC: Recent Labs    11/30/20 0858 06/01/21 0745  WBC 7.3 8.5  NEUTROABS 4,256 4,820  HGB 15.0 15.3  HCT 43.9 45.1  MCV 94.8 96.8  PLT 209 187   Lipid Panel: Recent Labs    11/30/20 0858 06/01/21 0745  CHOL 244* 178  HDL 42 47  LDLCALC 161* 99  TRIG 241* 202*  CHOLHDL 5.8* 3.8   Lab Results  Component Value Date   HGBA1C 6.5 (H) 06/01/2021    Procedures since last visit: No results found.  Assessment/Plan 1. Prediabetes  - Hemoglobin A1c; Future - TSH; Future  2. Essential hypertension Refuses more control - COMPLETE METABOLIC PANEL WITH GFR; Future - CBC with Differential/Platelet; Future  3. Hyperlipidemia, unspecified hyperlipidemia type Restarted Crestor - Lipid panel; Future  4. Coronary artery disease involving coronary bypass graft of native heart without angina pectoris Aspirin,statin and Beta blocker  5. Stage 3a chronic kidney disease (HCC) Creat stable - COMPLETE METABOLIC PANEL WITH GFR; Future - CBC with Differential/Platelet; Future  6. Urticaria Clobetasol PRN   7. Seasonal allergic rhinitis, unspecified trigger Zyrtec  8 LE edema Takes Demadex PRN   Labs/tests ordered:  * No order type specified * Next appt:  Visit date not found

## 2021-07-10 ENCOUNTER — Other Ambulatory Visit: Payer: Self-pay | Admitting: Internal Medicine

## 2021-07-13 ENCOUNTER — Other Ambulatory Visit: Payer: Self-pay | Admitting: Internal Medicine

## 2021-07-17 ENCOUNTER — Other Ambulatory Visit: Payer: Self-pay | Admitting: Internal Medicine

## 2021-07-17 DIAGNOSIS — E7849 Other hyperlipidemia: Secondary | ICD-10-CM

## 2021-08-15 ENCOUNTER — Other Ambulatory Visit: Payer: Self-pay | Admitting: Internal Medicine

## 2021-09-14 ENCOUNTER — Telehealth: Payer: Self-pay

## 2021-09-14 NOTE — Telephone Encounter (Signed)
Patient denied scheduling an appointment for his Medicare AWV in clinic with Windell Moulding, NP.  ?

## 2021-10-09 ENCOUNTER — Other Ambulatory Visit: Payer: Self-pay | Admitting: Internal Medicine

## 2021-11-30 DIAGNOSIS — E785 Hyperlipidemia, unspecified: Secondary | ICD-10-CM

## 2021-11-30 DIAGNOSIS — R7303 Prediabetes: Secondary | ICD-10-CM

## 2021-11-30 DIAGNOSIS — N1831 Chronic kidney disease, stage 3a: Secondary | ICD-10-CM

## 2021-11-30 DIAGNOSIS — I10 Essential (primary) hypertension: Secondary | ICD-10-CM

## 2021-12-01 LAB — CBC WITH DIFFERENTIAL/PLATELET
Absolute Monocytes: 774 cells/uL (ref 200–950)
Basophils Absolute: 87 cells/uL (ref 0–200)
Basophils Relative: 1.1 %
Eosinophils Absolute: 379 cells/uL (ref 15–500)
Eosinophils Relative: 4.8 %
HCT: 45 % (ref 38.5–50.0)
Hemoglobin: 15 g/dL (ref 13.2–17.1)
Lymphs Abs: 2307 cells/uL (ref 850–3900)
MCH: 31.7 pg (ref 27.0–33.0)
MCHC: 33.3 g/dL (ref 32.0–36.0)
MCV: 95.1 fL (ref 80.0–100.0)
MPV: 11.8 fL (ref 7.5–12.5)
Monocytes Relative: 9.8 %
Neutro Abs: 4353 cells/uL (ref 1500–7800)
Neutrophils Relative %: 55.1 %
Platelets: 192 10*3/uL (ref 140–400)
RBC: 4.73 10*6/uL (ref 4.20–5.80)
RDW: 11.9 % (ref 11.0–15.0)
Total Lymphocyte: 29.2 %
WBC: 7.9 10*3/uL (ref 3.8–10.8)

## 2021-12-01 LAB — COMPLETE METABOLIC PANEL WITH GFR
AG Ratio: 1.5 (calc) (ref 1.0–2.5)
ALT: 31 U/L (ref 9–46)
AST: 27 U/L (ref 10–35)
Albumin: 3.8 g/dL (ref 3.6–5.1)
Alkaline phosphatase (APISO): 66 U/L (ref 35–144)
BUN: 17 mg/dL (ref 7–25)
CO2: 25 mmol/L (ref 20–32)
Calcium: 9.1 mg/dL (ref 8.6–10.3)
Chloride: 105 mmol/L (ref 98–110)
Creat: 1.03 mg/dL (ref 0.70–1.22)
Globulin: 2.5 g/dL (calc) (ref 1.9–3.7)
Glucose, Bld: 105 mg/dL — ABNORMAL HIGH (ref 65–99)
Potassium: 4.3 mmol/L (ref 3.5–5.3)
Sodium: 138 mmol/L (ref 135–146)
Total Bilirubin: 0.5 mg/dL (ref 0.2–1.2)
Total Protein: 6.3 g/dL (ref 6.1–8.1)
eGFR: 67 mL/min/{1.73_m2} (ref >=60)

## 2021-12-01 LAB — LIPID PANEL
Cholesterol: 177 mg/dL (ref <200)
HDL: 50 mg/dL (ref >=40)
LDL Cholesterol (Calc): 101 mg/dL (calc) — ABNORMAL HIGH
Non-HDL Cholesterol (Calc): 127 mg/dL (calc) (ref <130)
Total CHOL/HDL Ratio: 3.5 (calc) (ref <5.0)
Triglycerides: 165 mg/dL — ABNORMAL HIGH (ref <150)

## 2021-12-01 LAB — HEMOGLOBIN A1C
Hgb A1c MFr Bld: 6.4 % of total Hgb — ABNORMAL HIGH (ref <5.7)
Mean Plasma Glucose: 137 mg/dL
eAG (mmol/L): 7.6 mmol/L

## 2021-12-01 LAB — TSH: TSH: 3.5 mIU/L (ref 0.40–4.50)

## 2021-12-05 ENCOUNTER — Encounter: Payer: Self-pay | Admitting: Internal Medicine

## 2021-12-06 ENCOUNTER — Non-Acute Institutional Stay: Payer: Medicare HMO | Admitting: Internal Medicine

## 2021-12-06 VITALS — BP 164/80 | HR 76 | Temp 97.0°F | Ht 70.0 in | Wt 205.0 lb

## 2021-12-06 DIAGNOSIS — R7303 Prediabetes: Secondary | ICD-10-CM | POA: Diagnosis not present

## 2021-12-06 DIAGNOSIS — I25810 Atherosclerosis of coronary artery bypass graft(s) without angina pectoris: Secondary | ICD-10-CM | POA: Diagnosis not present

## 2021-12-06 DIAGNOSIS — E785 Hyperlipidemia, unspecified: Secondary | ICD-10-CM

## 2021-12-06 DIAGNOSIS — N1831 Chronic kidney disease, stage 3a: Secondary | ICD-10-CM

## 2021-12-06 DIAGNOSIS — L509 Urticaria, unspecified: Secondary | ICD-10-CM

## 2021-12-06 DIAGNOSIS — I10 Essential (primary) hypertension: Secondary | ICD-10-CM | POA: Diagnosis not present

## 2021-12-06 NOTE — Patient Instructions (Signed)
You can go to pharmacy and get new Pneumonia shot and Shingrix

## 2021-12-07 NOTE — Progress Notes (Signed)
Location:  [facility] (12)  Provider:   Code Status:  Goals of Care:     12/07/2021    4:04 PM  Advanced Directives  Does Patient Have a Medical Advance Directive? Yes  Type of Paramedic of [place];Living will;Out of facility DNR (pink MOST or yellow form)  Does patient want to make changes to medical advance directive? No - Patient declined  Copy of [place] in Chart? Yes - validated most recent copy scanned in chart (See row information)  Pre-existing out of facility DNR order (yellow form or pink MOST form) Pink MOST form placed in chart (order not valid for inpatient use)     Chief Complaint  Patient presents with   Medical Management of Chronic Issues    Patient returns to the clinic for 6 month follow up and discuss labs.    Quality Metric Gaps    Verified [facility] and [facility] patient is due for: Zoster Vaccines- Shingrix (1 of 2)  COVID-19 Vaccine      HPI: Patient is a 88 y.o. male seen today for medical management of chronic diseases.    Has h/o Hypertension, hyperlipidemia, lower extremity edema, CAD s/p CABG, anxiety with insomnia, allergic rhinitis   BP runs slightly high but refuses more treatment  Chronic Urticaria Want to know if he should try [facility]  No Other issues No Falls. Walks well Weight stable Takes care of his IADLs.  Has a nephew in [place] who is the POA. Past Medical History:  Diagnosis Date   Anxiety    AV block, 1st degree 08/06/07   Back pain    Cellulitis of lower extremity 01/11/2015   Coccygodynia 01/31/10   Coronary artery disease    Diverticulosis 1980   Dupuytren contracture 08/06/07   4th finger left hand   Essential hypertension, malignant    Gastric ulcer 12/30/01   Hearing loss 01/16/05   Hyperhidrosis, focal, primary 08/06/07   Hyperlipemia    Insomnia    Internal thrombosed hemorrhoids 04/30/01   Keratosis, actinic 08/03/09    Slowing of urinary stream 08/04/07   Vitamin D deficiency 05/27/09    Past Surgical History:  Procedure Laterality Date   CATARACT EXTRACTION W/ INTRAOCULAR LENS IMPLANT Right 1990   Dr. Charise Killian   CATARACT EXTRACTION W/ INTRAOCULAR LENS IMPLANT Left 1992   Dr. Charise Killian   COLONOSCOPY  2003   Diverticulosis   CORONARY ARTERY BYPASS GRAFT  1998   [facility] Redmond Pulling [facility]   RECTAL POLYPECTOMY  1971   Dr. Rolin Barry    Allergies  Allergen Reactions   Furosemide Itching   Orange Juice [Orange Oil] Other (See Comments)    indegestion    Outpatient Encounter Medications as of 12/06/2021  Medication Sig   aspirin 81 MG tablet Take 81 mg by mouth daily.   cetirizine (ZYRTEC) 10 MG tablet TAKE 1 TABLET DAILY   Cholecalciferol (VITAMIN D3) 50 MCG (2000 UT) TABS Take 1 tablet by mouth daily.   clobetasol cream (TEMOVATE) [id number] % Apply 1 application topically 2 (two) times daily.   famotidine (PEPCID) 20 MG tablet TAKE 1 TABLET DAILY   metoprolol tartrate (LOPRESSOR) 50 MG tablet TAKE 1 TABLET TWICE A DAY   Propylene Glycol (SYSTANE BALANCE) 0.6 % SOLN Apply 1 drop to eye as needed. Both eyes   rosuvastatin (CRESTOR) 10 MG tablet TAKE 1 TABLET DAILY TO LOWER CHOLESTEROL   torsemide (DEMADEX)  20 MG tablet TAKE ONE-HALF TABLET (10 MG) AS NEEDED   triamcinolone cream (KENALOG) 0.1 % APPLY 1 APPLICATION TOPICALLY AS NEEDED   [DISCONTINUED] potassium chloride (K-DUR) 10 MEQ tablet Take 1 tablet (10 mEq total) by mouth daily as needed (take with torsemide).   [DISCONTINUED] potassium chloride (KLOR-CON M) 10 MEQ tablet TAKE 1 TABLET DAILY AS NEEDED (TAKE WITH TORSEMIDE)   No facility-administered encounter medications on file as of 12/06/2021.    Review of Systems:  Review of Systems  Constitutional:  Negative for activity change, appetite change and unexpected weight change.  HENT: Negative.    Respiratory:  Negative for cough and shortness of breath.   Cardiovascular:  Negative for leg swelling.   Gastrointestinal:  Negative for constipation.  Genitourinary:  Negative for frequency.  Musculoskeletal:  Negative for arthralgias, gait problem and myalgias.  Skin:  Positive for rash.  Neurological:  Negative for dizziness and weakness.  Psychiatric/Behavioral:  Negative for confusion and sleep disturbance.   All other systems reviewed and are negative.   Health Maintenance  Topic Date Due   Zoster Vaccines- Shingrix (1 of 2) Never done   COVID-19 Vaccine (3 - Moderna series) 09/21/2019   INFLUENZA VACCINE  01/23/2022   TETANUS/TDAP  12/01/2029   Pneumonia Vaccine 69+ Years old  Completed   HPV VACCINES  Aged Out    Physical Exam: Vitals:   12/06/21 1336  BP: (!) 164/80  Pulse: 76  Temp: (!) 97 F (36.1 C)  SpO2: 97%  Weight: 205 lb (93 kg)  Height: 5\' 10"  (1.778 m)   Body mass index is 29.41 kg/m. Physical Exam Vitals reviewed.  Constitutional:      Appearance: Normal appearance.  HENT:     Head: Normocephalic.     Nose: Nose normal.     Mouth/Throat:     Mouth: Mucous membranes are moist.     Pharynx: Oropharynx is clear.  Eyes:     Pupils: Pupils are equal, round, and reactive to light.  Cardiovascular:     Rate and Rhythm: Normal rate and regular rhythm.     Pulses: Normal pulses.     Heart sounds: No murmur heard. Pulmonary:     Effort: Pulmonary effort is normal. No respiratory distress.     Breath sounds: Normal breath sounds. No rales.  Abdominal:     General: Abdomen is flat. Bowel sounds are normal.     Palpations: Abdomen is soft.  Musculoskeletal:        General: Swelling present.     Cervical back: Neck supple.  Skin:    General: Skin is warm.     Comments: Chronic Macular Rash in the back  Neurological:     General: No focal deficit present.     Mental Status: He is alert and oriented to person, place, and time.  Psychiatric:        Mood and Affect: Mood normal.        Thought Content: Thought content normal.     Labs  reviewed: Basic Metabolic Panel: Recent Labs    06/01/21 0745 11/30/21 0810  NA 142 138  K 4.2 4.3  CL 105 105  CO2 25 25  GLUCOSE 92 105*  BUN 16 17  CREATININE 0.96 1.03  CALCIUM 9.2 9.1  TSH 4.91* 3.50   Liver Function Tests: Recent Labs    06/01/21 0745 11/30/21 0810  AST 40* 27  ALT 37 31  BILITOT 0.5 0.5  PROT 6.6 6.3  No results for input(s): "LIPASE", "AMYLASE" in the last 8760 hours. No results for input(s): "AMMONIA" in the last 8760 hours. CBC: Recent Labs    06/01/21 0745 11/30/21 0810  WBC 8.5 7.9  NEUTROABS 4,820 4,353  HGB 15.3 15.0  HCT 45.1 45.0  MCV 96.8 95.1  PLT 187 192   Lipid Panel: Recent Labs    06/01/21 0745 11/30/21 0810  CHOL 178 177  HDL 47 50  LDLCALC 99 101*  TRIG 202* 165*  CHOLHDL 3.8 3.5   Lab Results  Component Value Date   HGBA1C 6.4 (H) 11/30/2021    Procedures since last visit: No results found.  Assessment/Plan 1. Prediabetes Diet and Exercise  2. Essential hypertension Refuses more Meds Lopressor   3. Hyperlipidemia, unspecified hyperlipidemia type On QOD statin  4. Coronary artery disease involving coronary bypass graft of native heart without angina pectoris On Aspirin Beta Blocker and statin Asymptomatic  5. Stage 3a chronic kidney disease (HCC) Creat stable  6. Urticaria Will d/w his dermatology for Dupixent 7 LE edema Takes Torsemide PRN   Labs/tests ordered:  * No order type specified * Next appt:  Visit date not found

## 2022-04-05 ENCOUNTER — Ambulatory Visit (INDEPENDENT_AMBULATORY_CARE_PROVIDER_SITE_OTHER): Payer: Medicare HMO | Admitting: Orthopedic Surgery

## 2022-04-05 ENCOUNTER — Encounter: Payer: Self-pay | Admitting: Orthopedic Surgery

## 2022-04-05 VITALS — BP 136/82 | HR 82 | Temp 97.1°F | Ht 70.0 in | Wt 209.6 lb

## 2022-04-05 DIAGNOSIS — W5501XA Bitten by cat, initial encounter: Secondary | ICD-10-CM

## 2022-04-05 DIAGNOSIS — S61551A Open bite of right wrist, initial encounter: Secondary | ICD-10-CM

## 2022-04-05 MED ORDER — AMOXICILLIN-POT CLAVULANATE 875-125 MG PO TABS: 1.0000 | ORAL_TABLET | Freq: Two times a day (BID) | ORAL | 0 refills | Status: DC

## 2022-04-05 NOTE — Progress Notes (Signed)
Careteam: Patient Care Team: Mahlon Gammon, MD as PCP - General (Internal Medicine) Lucinda Dell, MD (Dermatology) Ngetich, Donalee Citrin, NP as Nurse Practitioner (Family Medicine)  Seen by: Hazle Nordmann, AGNP-C  PLACE OF SERVICE:  [facility]  Advanced Directive information    Allergies  Allergen Reactions   Furosemide Itching   Orange Juice [Orange Oil] Other (See Comments)    indegestion    No chief complaint on file.    HPI: Patient is a 88 y.o. male seen today for acute visit due to cat bite.   2 days ago he was bit by a family cat. He had 2 small puncture wounds to right wrist. Today, he woke up with increased erythema and swelling to right hand extending to forearm. He denies fever, increased pain or drainage.     Review of Systems:  Review of Systems  Constitutional:  Negative for chills, fever, malaise/fatigue and weight loss.  Respiratory:  Negative for cough, shortness of breath and wheezing.   Cardiovascular:  Negative for chest pain and leg swelling.  Musculoskeletal:  Negative for falls and joint pain.  Neurological:  Negative for sensory change and weakness.  Psychiatric/Behavioral:  Negative for depression. The patient is not nervous/anxious.     Past Medical History:  Diagnosis Date   Anxiety    AV block, 1st degree 08/06/07   Back pain    Cellulitis of lower extremity 01/11/2015   Coccygodynia 01/31/10   Coronary artery disease    Diverticulosis 1980   Dupuytren contracture 08/06/07   4th finger left hand   Essential hypertension, malignant    Gastric ulcer 12/30/01   Hearing loss 01/16/05   Hyperhidrosis, focal, primary 08/06/07   Hyperlipemia    Insomnia    Internal thrombosed hemorrhoids 04/30/01   Keratosis, actinic 08/03/09   Slowing of urinary stream 08/04/07   Vitamin D deficiency 05/27/09   Past Surgical History:  Procedure Laterality Date   CATARACT EXTRACTION W/ INTRAOCULAR LENS IMPLANT Right 1990   Dr. Emmit Pomfret   CATARACT EXTRACTION W/  INTRAOCULAR LENS IMPLANT Left 1992   Dr. Emmit Pomfret   COLONOSCOPY  2003   Diverticulosis   CORONARY ARTERY BYPASS GRAFT  1998   [facility]   RECTAL POLYPECTOMY  1971   Dr. Elesa Hacker   Social History:   reports that he quit smoking about 44 years ago. His smoking use included cigars. He has never used smokeless tobacco. He reports current alcohol use. He reports that he does not use drugs.  Family History  Problem Relation Age of Onset   Heart disease Mother    Heart disease Father    Parkinson's disease Father    Diabetes Sister    Kidney disease Sister    Heart disease Sister    Lung disease Brother    Diabetes Sister    Cerebrovascular Accident Sister     Medications: Patient's Medications  New Prescriptions   No medications on file  Previous Medications   ASPIRIN 81 MG TABLET    Take 81 mg by mouth daily.   CETIRIZINE (ZYRTEC) 10 MG TABLET    TAKE 1 TABLET DAILY   CHOLECALCIFEROL (VITAMIN D3) 50 MCG (2000 UT) TABS    Take 1 tablet by mouth daily.   CLOBETASOL CREAM (TEMOVATE) 0.05 %    Apply 1 application topically 2 (two) times daily.   FAMOTIDINE (PEPCID) 20 MG TABLET    TAKE 1 TABLET DAILY   METOPROLOL TARTRATE (LOPRESSOR) 50  MG TABLET    TAKE 1 TABLET TWICE A DAY   PROPYLENE GLYCOL (SYSTANE BALANCE) 0.6 % SOLN    Apply 1 drop to eye as needed. Both eyes   ROSUVASTATIN (CRESTOR) 10 MG TABLET    TAKE 1 TABLET DAILY TO LOWER CHOLESTEROL   TORSEMIDE (DEMADEX) 20 MG TABLET    TAKE ONE-HALF TABLET (10 MG) AS NEEDED   TRIAMCINOLONE CREAM (KENALOG) 0.1 %    APPLY 1 APPLICATION TOPICALLY AS NEEDED  Modified Medications   No medications on file  Discontinued Medications   No medications on file    Physical Exam:  Vitals:   04/05/22 1113  BP: 136/82  Pulse: 82  Temp: (!) 97.1 F (36.2 C)  SpO2: 96%  Weight: 209 lb 9.6 oz (95.1 kg)  Height: 5\' 10"  (1.778 m)   Body mass index is 30.07 kg/m. Wt Readings from Last 3 Encounters:  04/05/22 209 lb 9.6 oz (95.1 kg)   12/06/21 205 lb (93 kg)  06/07/21 205 lb 4.8 oz (93.1 kg)    Physical Exam Vitals reviewed.  Constitutional:      General: He is not in acute distress. HENT:     Head: Normocephalic.  Eyes:     General:        Right eye: No discharge.        Left eye: No discharge.  Cardiovascular:     Rate and Rhythm: Normal rate and regular rhythm.     Pulses: Normal pulses.     Heart sounds: Normal heart sounds.  Pulmonary:     Effort: Pulmonary effort is normal. No respiratory distress.     Breath sounds: Normal breath sounds. No wheezing.  Musculoskeletal:     Cervical back: Neck supple.  Skin:    Capillary Refill: Capillary refill takes less than 2 seconds.     Findings: Erythema present.     Comments: Increased erythema and swelling from right dorsal hand to forearm. 2 small puncture marks noted near wrist, no drainage/tenderness, extremity warm to touch.   Neurological:     General: No focal deficit present.     Mental Status: He is alert and oriented to person, place, and time.  Psychiatric:        Mood and Affect: Mood normal.        Behavior: Behavior normal.     Labs reviewed: Basic Metabolic Panel: Recent Labs    06/01/21 0745 11/30/21 0810  NA 142 138  K 4.2 4.3  CL 105 105  CO2 25 25  GLUCOSE 92 105*  BUN 16 17  CREATININE 0.96 1.03  CALCIUM 9.2 9.1  TSH 4.91* 3.50   Liver Function Tests: Recent Labs    06/01/21 0745 11/30/21 0810  AST 40* 27  ALT 37 31  BILITOT 0.5 0.5  PROT 6.6 6.3   No results for input(s): "LIPASE", "AMYLASE" in the last 8760 hours. No results for input(s): "AMMONIA" in the last 8760 hours. CBC: Recent Labs    06/01/21 0745 11/30/21 0810  WBC 8.5 7.9  NEUTROABS 4,820 4,353  HGB 15.3 15.0  HCT 45.1 45.0  MCV 96.8 95.1  PLT 187 192   Lipid Panel: Recent Labs    06/01/21 0745 11/30/21 0810  CHOL 178 177  HDL 47 50  LDLCALC 99 101*  TRIG 202* 165*  CHOLHDL 3.8 3.5   TSH: Recent Labs    06/01/21 0745  11/30/21 0810  TSH 4.91* 3.50   A1C: Lab Results  Component Value  Date   HGBA1C 6.4 (H) 11/30/2021     Assessment/Plan 1. Cat bite of right wrist, initial encounter - cat bite 04/03/2022 - erythema and swelling from right dorsal hand to mid forearm - advised to contact PCP if wound does not improve/worsens  - amoxicillin-clavulanate (AUGMENTIN) 875-125 MG tablet; Take 1 tablet by mouth 2 (two) times daily.  Dispense: 20 tablet; Refill: 0  Total time: 15 minutes. Greater than 50% of total time spent doing patient education regarding cat bite injury.    Next appt: Visit date not found  Makilah Dowda Scherry Ran  [facility] [phone]

## 2022-04-05 NOTE — Patient Instructions (Signed)
If right wrist does not look better by Monday 10/16- please call provider  Please finish all antibiotics.

## 2022-04-07 ENCOUNTER — Encounter (HOSPITAL_COMMUNITY): Payer: Self-pay

## 2022-04-07 ENCOUNTER — Emergency Department (HOSPITAL_BASED_OUTPATIENT_CLINIC_OR_DEPARTMENT_OTHER): Payer: Medicare HMO

## 2022-04-07 ENCOUNTER — Other Ambulatory Visit: Payer: Self-pay

## 2022-04-07 ENCOUNTER — Inpatient Hospital Stay (HOSPITAL_BASED_OUTPATIENT_CLINIC_OR_DEPARTMENT_OTHER)
Admission: EM | Admit: 2022-04-07 | Discharge: 2022-04-10 | DRG: 605 | Disposition: A | Discharge status: Home / Self Care | Payer: Medicare HMO | Attending: Family Medicine | Admitting: Family Medicine

## 2022-04-07 ENCOUNTER — Encounter (HOSPITAL_BASED_OUTPATIENT_CLINIC_OR_DEPARTMENT_OTHER): Payer: Self-pay

## 2022-04-07 DIAGNOSIS — Z833 Family history of diabetes mellitus: Secondary | ICD-10-CM | POA: Diagnosis not present

## 2022-04-07 DIAGNOSIS — Y929 Unspecified place or not applicable: Secondary | ICD-10-CM

## 2022-04-07 DIAGNOSIS — R197 Diarrhea, unspecified: Secondary | ICD-10-CM | POA: Diagnosis not present

## 2022-04-07 DIAGNOSIS — K521 Toxic gastroenteritis and colitis: Secondary | ICD-10-CM | POA: Diagnosis present

## 2022-04-07 DIAGNOSIS — F419 Anxiety disorder, unspecified: Secondary | ICD-10-CM | POA: Diagnosis present

## 2022-04-07 DIAGNOSIS — I2510 Atherosclerotic heart disease of native coronary artery without angina pectoris: Secondary | ICD-10-CM

## 2022-04-07 DIAGNOSIS — Z820 Family history of epilepsy and other diseases of the nervous system: Secondary | ICD-10-CM | POA: Diagnosis not present

## 2022-04-07 DIAGNOSIS — R112 Nausea with vomiting, unspecified: Secondary | ICD-10-CM

## 2022-04-07 DIAGNOSIS — Z841 Family history of disorders of kidney and ureter: Secondary | ICD-10-CM

## 2022-04-07 DIAGNOSIS — T360X5A Adverse effect of penicillins, initial encounter: Secondary | ICD-10-CM | POA: Diagnosis present

## 2022-04-07 DIAGNOSIS — Z9842 Cataract extraction status, left eye: Secondary | ICD-10-CM | POA: Diagnosis not present

## 2022-04-07 DIAGNOSIS — E559 Vitamin D deficiency, unspecified: Secondary | ICD-10-CM | POA: Diagnosis present

## 2022-04-07 DIAGNOSIS — Z888 Allergy status to other drugs, medicaments and biological substances status: Secondary | ICD-10-CM

## 2022-04-07 DIAGNOSIS — Z823 Family history of stroke: Secondary | ICD-10-CM

## 2022-04-07 DIAGNOSIS — I10 Essential (primary) hypertension: Secondary | ICD-10-CM | POA: Diagnosis present

## 2022-04-07 DIAGNOSIS — Z66 Do not resuscitate: Secondary | ICD-10-CM | POA: Diagnosis present

## 2022-04-07 DIAGNOSIS — E785 Hyperlipidemia, unspecified: Secondary | ICD-10-CM | POA: Diagnosis present

## 2022-04-07 DIAGNOSIS — K219 Gastro-esophageal reflux disease without esophagitis: Secondary | ICD-10-CM | POA: Diagnosis present

## 2022-04-07 DIAGNOSIS — G4700 Insomnia, unspecified: Secondary | ICD-10-CM | POA: Diagnosis present

## 2022-04-07 DIAGNOSIS — F411 Generalized anxiety disorder: Secondary | ICD-10-CM

## 2022-04-07 DIAGNOSIS — L03113 Cellulitis of right upper limb: Secondary | ICD-10-CM

## 2022-04-07 DIAGNOSIS — M85841 Other specified disorders of bone density and structure, right hand: Secondary | ICD-10-CM | POA: Diagnosis present

## 2022-04-07 DIAGNOSIS — S61451A Open bite of right hand, initial encounter: Secondary | ICD-10-CM | POA: Diagnosis present

## 2022-04-07 DIAGNOSIS — Z87891 Personal history of nicotine dependence: Secondary | ICD-10-CM | POA: Diagnosis not present

## 2022-04-07 DIAGNOSIS — W5501XA Bitten by cat, initial encounter: Secondary | ICD-10-CM | POA: Diagnosis present

## 2022-04-07 DIAGNOSIS — Z91018 Allergy to other foods: Secondary | ICD-10-CM

## 2022-04-07 DIAGNOSIS — H9190 Unspecified hearing loss, unspecified ear: Secondary | ICD-10-CM | POA: Diagnosis present

## 2022-04-07 DIAGNOSIS — Z9841 Cataract extraction status, right eye: Secondary | ICD-10-CM | POA: Diagnosis not present

## 2022-04-07 DIAGNOSIS — Z951 Presence of aortocoronary bypass graft: Secondary | ICD-10-CM | POA: Diagnosis not present

## 2022-04-07 DIAGNOSIS — Z23 Encounter for immunization: Secondary | ICD-10-CM | POA: Diagnosis present

## 2022-04-07 DIAGNOSIS — Z961 Presence of intraocular lens: Secondary | ICD-10-CM | POA: Diagnosis present

## 2022-04-07 DIAGNOSIS — Z8249 Family history of ischemic heart disease and other diseases of the circulatory system: Secondary | ICD-10-CM

## 2022-04-07 DIAGNOSIS — Z79899 Other long term (current) drug therapy: Secondary | ICD-10-CM

## 2022-04-07 DIAGNOSIS — L03818 Cellulitis of other sites: Secondary | ICD-10-CM

## 2022-04-07 DIAGNOSIS — T361X5A Adverse effect of cephalosporins and other beta-lactam antibiotics, initial encounter: Secondary | ICD-10-CM | POA: Diagnosis present

## 2022-04-07 DIAGNOSIS — L0390 Cellulitis, unspecified: Secondary | ICD-10-CM | POA: Insufficient documentation

## 2022-04-07 LAB — URINALYSIS, ROUTINE W REFLEX MICROSCOPIC
Bilirubin Urine: NEGATIVE
Glucose, UA: NEGATIVE mg/dL
Hgb urine dipstick: NEGATIVE
Ketones, ur: NEGATIVE mg/dL
Leukocytes,Ua: NEGATIVE
Nitrite: NEGATIVE
Protein, ur: NEGATIVE mg/dL
Specific Gravity, Urine: 1.021 (ref 1.005–1.030)
pH: 6.5 (ref 5.0–8.0)

## 2022-04-07 LAB — COMPREHENSIVE METABOLIC PANEL
ALT: 19 U/L (ref 0–44)
AST: 19 U/L (ref 15–41)
Albumin: 3.8 g/dL (ref 3.5–5.0)
Alkaline Phosphatase: 59 U/L (ref 38–126)
Anion gap: 9 (ref 5–15)
BUN: 12 mg/dL (ref 8–23)
CO2: 23 mmol/L (ref 22–32)
Calcium: 9.3 mg/dL (ref 8.9–10.3)
Chloride: 103 mmol/L (ref 98–111)
Creatinine, Ser: 1.03 mg/dL (ref 0.61–1.24)
GFR, Estimated: >60 mL/min (ref >60)
Glucose, Bld: 110 mg/dL — ABNORMAL HIGH (ref 70–99)
Potassium: 3.9 mmol/L (ref 3.5–5.1)
Sodium: 135 mmol/L (ref 135–145)
Total Bilirubin: 0.7 mg/dL (ref 0.3–1.2)
Total Protein: 7.1 g/dL (ref 6.5–8.1)

## 2022-04-07 LAB — CBC WITH DIFFERENTIAL/PLATELET
Abs Immature Granulocytes: 0.07 10*3/uL (ref 0.00–0.07)
Basophils Absolute: 0.1 10*3/uL (ref 0.0–0.1)
Basophils Relative: 1 %
Eosinophils Absolute: 0.2 10*3/uL (ref 0.0–0.5)
Eosinophils Relative: 2 %
HCT: 42.7 % (ref 39.0–52.0)
Hemoglobin: 14.3 g/dL (ref 13.0–17.0)
Immature Granulocytes: 1 %
Lymphocytes Relative: 16 %
Lymphs Abs: 2.1 10*3/uL (ref 0.7–4.0)
MCH: 32.5 pg (ref 26.0–34.0)
MCHC: 33.5 g/dL (ref 30.0–36.0)
MCV: 97 fL (ref 80.0–100.0)
Monocytes Absolute: 1.5 10*3/uL — ABNORMAL HIGH (ref 0.1–1.0)
Monocytes Relative: 12 %
Neutro Abs: 9.1 10*3/uL — ABNORMAL HIGH (ref 1.7–7.7)
Neutrophils Relative %: 68 %
Platelets: 159 10*3/uL (ref 150–400)
RBC: 4.4 MIL/uL (ref 4.22–5.81)
RDW: 12.6 % (ref 11.5–15.5)
WBC: 13 10*3/uL — ABNORMAL HIGH (ref 4.0–10.5)
nRBC: 0 % (ref 0.0–0.2)

## 2022-04-07 LAB — LIPASE, BLOOD: Lipase: 14 U/L (ref 11–51)

## 2022-04-07 MED ORDER — PROCHLORPERAZINE EDISYLATE 10 MG/2ML IJ SOLN: 10.0000 mg | Freq: Four times a day (QID) | INTRAMUSCULAR | Status: DC | PRN

## 2022-04-07 MED ORDER — OXYCODONE HCL 5 MG PO TABS: 5.0000 mg | ORAL_TABLET | ORAL | Status: DC | PRN

## 2022-04-07 MED ORDER — ACETAMINOPHEN 650 MG RE SUPP: 650.0000 mg | Freq: Four times a day (QID) | RECTAL | Status: DC | PRN

## 2022-04-07 MED ORDER — SODIUM CHLORIDE 0.9 % IV SOLN
3.0000 g | Freq: Three times a day (TID) | INTRAVENOUS | Status: DC
  Administered 2022-04-07 – 2022-04-09 (×7): 3 g via INTRAVENOUS
  Filled 2022-04-07 (×6): qty 8

## 2022-04-07 MED ORDER — LORATADINE 10 MG PO TABS
10.0000 mg | ORAL_TABLET | Freq: Every day | ORAL | Status: DC
  Administered 2022-04-08 – 2022-04-10 (×3): 10 mg via ORAL
  Filled 2022-04-07 (×3): qty 1

## 2022-04-07 MED ORDER — ENOXAPARIN SODIUM 40 MG/0.4ML IJ SOSY
40.0000 mg | PREFILLED_SYRINGE | INTRAMUSCULAR | Status: DC
  Administered 2022-04-07 – 2022-04-09 (×3): 40 mg via SUBCUTANEOUS
  Filled 2022-04-07 (×3): qty 0.4

## 2022-04-07 MED ORDER — METOPROLOL TARTRATE 50 MG PO TABS
50.0000 mg | ORAL_TABLET | Freq: Two times a day (BID) | ORAL | Status: DC
  Administered 2022-04-07 – 2022-04-10 (×6): 50 mg via ORAL
  Filled 2022-04-07 (×6): qty 1

## 2022-04-07 MED ORDER — ACETAMINOPHEN 325 MG PO TABS: 650.0000 mg | ORAL_TABLET | Freq: Four times a day (QID) | ORAL | Status: DC | PRN

## 2022-04-07 MED ORDER — ONDANSETRON HCL 4 MG/2ML IJ SOLN
4.0000 mg | Freq: Once | INTRAMUSCULAR | Status: DC
  Filled 2022-04-07: qty 2

## 2022-04-07 MED ORDER — VITAMIN D 25 MCG (1000 UNIT) PO TABS
50.0000 ug | ORAL_TABLET | Freq: Every day | ORAL | Status: DC
  Administered 2022-04-08 – 2022-04-10 (×3): 50 ug via ORAL
  Filled 2022-04-07: qty 2
  Filled 2022-04-07 (×3): qty 1

## 2022-04-07 MED ORDER — IOHEXOL 300 MG/ML  SOLN
100.0000 mL | Freq: Once | INTRAMUSCULAR | Status: AC | PRN
  Administered 2022-04-07: 100 mL via INTRAVENOUS

## 2022-04-07 MED ORDER — POLYVINYL ALCOHOL 1.4 % OP SOLN: 1.0000 [drp] | OPHTHALMIC | Status: DC | PRN

## 2022-04-07 MED ORDER — SODIUM CHLORIDE 0.9 % IV BOLUS
1000.0000 mL | Freq: Once | INTRAVENOUS | Status: AC
  Administered 2022-04-07: 1000 mL via INTRAVENOUS

## 2022-04-07 MED ORDER — ASPIRIN 81 MG PO TBEC
81.0000 mg | DELAYED_RELEASE_TABLET | Freq: Every day | ORAL | Status: DC
  Administered 2022-04-07 – 2022-04-10 (×4): 81 mg via ORAL
  Filled 2022-04-07 (×4): qty 1

## 2022-04-07 NOTE — ED Provider Notes (Signed)
[facility] GENER[facility] Provider Note  CSN: [id number] Arrival date & time: 04/07/22 0934  Chief Complaint(s) Emesis and Diarrhea  HPI Samuel Clayton is a 88 y.o. male with a history of coronary artery disease, hypertension, hyperlipidemia presenting with right hand pain and diarrhea.    Patient reports he was bit by his nieces cat last week, saw doctor 2 days ago and was prescribed Augmentin, has taken 4 pills of it but having persistent diarrhea and vomiting whenever he takes it.  He reports the area of the swelling is the right wrist.  He reports that his symptoms are worsening.  He denies any fevers or chills.  He reports pain with movement but not at rest.  Patient also reports diarrhea for the past 2 days after taking Augmentin, reports dark stools, associated vomiting without blood.  Denies similar episodes in the past.  Reports mild generalized abdominal discomfort.  No fevers or chills.   Past Medical History Past Medical History:  Diagnosis Date   Anxiety    AV block, 1st degree 08/06/07   Back pain    Cellulitis of lower extremity 01/11/2015   Coccygodynia 01/31/10   Coronary artery disease    Diverticulosis 1980   Dupuytren contracture 08/06/07   4th finger left hand   Essential hypertension, malignant    Gastric ulcer 12/30/01   Hearing loss 01/16/05   Hyperhidrosis, focal, primary 08/06/07   Hyperlipemia    Insomnia    Internal thrombosed hemorrhoids 04/30/01   Keratosis, actinic 08/03/09   Slowing of urinary stream 08/04/07   Vitamin D deficiency 05/27/09   Patient Active Problem List   Diagnosis Date Noted   Cat bite 04/07/2022   Cellulitis of right hand 04/07/2022   Diarrhea 04/07/2022   N&V (nausea and vomiting) 04/07/2022   Cellulitis    Rectal polyp 08/11/2019   GERD (gastroesophageal reflux disease) 12/07/2018   Chronic constipation 01/27/2018   First degree AV block 01/27/2018   Urticaria 01/27/2018   Chronic midline low back pain without sciatica  10/30/2017   Prediabetes 02/06/2017   Anxiety disorder 02/06/2017   Eczema 05/17/2015   Xerosis cutis 04/12/2015   Skin lesion of back 03/22/2015   CAD (coronary artery disease) 02/08/2015   HLD (hyperlipidemia) 02/08/2015   HTN (hypertension) 02/08/2015   Insomnia 02/08/2015   Edema 02/08/2015   Venous (peripheral) insufficiency 02/08/2015   Hearing loss 02/08/2015   Home Medication(s) Prior to Admission medications   Medication Sig Start Date End Date Taking? Authorizing Provider  aspirin 81 MG tablet Take 81 mg by mouth daily.   Yes [provider]  cetirizine (ZYRTEC) 10 MG tablet TAKE 1 TABLET DAILY Patient taking differently: Take 10 mg by mouth daily. 08/15/21  Yes Mahlon Gammon, MD  Cholecalciferol (VITAMIN D3) 50 MCG (2000 UT) TABS Take 1 tablet by mouth daily.   Yes [provider]  famotidine (PEPCID) 20 MG tablet TAKE 1 TABLET DAILY Patient taking differently: Take 20 mg by mouth daily. 08/15/21  Yes Mahlon Gammon, MD  metoprolol tartrate (LOPRESSOR) 50 MG tablet TAKE 1 TABLET TWICE A DAY Patient taking differently: Take 50 mg by mouth 2 (two) times daily. 07/13/21  Yes Mahlon Gammon, MD  Propylene Glycol (SYSTANE BALANCE) 0.6 % SOLN Apply 1 drop to eye as needed. Both eyes Patient taking differently: Apply 1 drop to eye as needed. Both eyes dry eyes 07/20/20  Yes Mahlon Gammon, MD  torsemide (DEMADEX) 20 MG tablet TAKE ONE-HALF  TABLET (10 MG) AS NEEDED Patient taking differently: Take 10 mg by mouth daily as needed (fluid retention). TAKE ONE-HALF TABLET (10 MG) AS NEEDED 10/09/21  Yes Mahlon Gammon, MD  triamcinolone cream (KENALOG) 0.1 % APPLY 1 APPLICATION TOPICALLY AS NEEDED Patient taking differently: Apply 1 application  topically as needed (itching). 08/15/21  Yes Mahlon Gammon, MD  amoxicillin-clavulanate (AUGMENTIN) 875-125 MG tablet Take 1 tablet by mouth 2 (two) times daily. Patient not taking: Reported on 04/07/2022 04/05/22   Octavia Heir, NP  clobetasol cream (TEMOVATE) 0.05 % Apply 1 application topically 2 (two) times daily. Patient not taking: Reported on 04/07/2022 12/07/20   Mahlon Gammon, MD  rosuvastatin (CRESTOR) 10 MG tablet TAKE 1 TABLET DAILY TO LOWER CHOLESTEROL Patient not taking: Reported on 04/07/2022 07/17/21   Mahlon Gammon, MD  potassium chloride (K-DUR) 10 MEQ tablet Take 1 tablet (10 mEq total) by mouth daily as needed (take with torsemide). 12/07/18 11/09/19  Mahlon Gammon, MD                                                                                                                                    Past Surgical History Past Surgical History:  Procedure Laterality Date   CATARACT EXTRACTION W/ INTRAOCULAR LENS IMPLANT Right 1990   Dr. Emmit Pomfret   CATARACT EXTRACTION W/ INTRAOCULAR LENS IMPLANT Left 1992   Dr. Emmit Pomfret   COLONOSCOPY  2003   Diverticulosis   CORONARY ARTERY BYPASS GRAFT  1998   [facility]   RECTAL POLYPECTOMY  1971   Dr. Elesa Hacker   Family History Family History  Problem Relation Age of Onset   Heart disease Mother    Heart disease Father    Parkinson's disease Father    Diabetes Sister    Kidney disease Sister    Heart disease Sister    Lung disease Brother    Diabetes Sister    Cerebrovascular Accident Sister     Social History Social History   Tobacco Use   Smoking status: Former    Types: Cigars    Quit date: 06/25/1977    Years since quitting: 44.8   Smokeless tobacco: Never  Vaping Use   Vaping Use: Never used  Substance Use Topics   Alcohol use: Yes    Comment: occasional beer   Drug use: No   Allergies Furosemide and Orange juice [orange oil]  Review of Systems Review of Systems  All other systems reviewed and are negative.   Physical Exam Vital Signs  I have reviewed the triage vital signs BP (!) 165/77 (BP Location: Left Arm)   Pulse 82   Temp 98 F (36.7 C) (Oral)   Resp 18   Ht 5\' 10"  (1.778 m)   Wt 95.1 kg   SpO2 97%   BMI  30.08 kg/m  Physical Exam Vitals and nursing note reviewed.  Constitutional:  General: He is not in acute distress.    Appearance: Normal appearance.  HENT:     Mouth/Throat:     Mouth: Mucous membranes are moist.  Eyes:     Conjunctiva/sclera: Conjunctivae normal.  Cardiovascular:     Rate and Rhythm: Normal rate and regular rhythm.  Pulmonary:     Effort: Pulmonary effort is normal. No respiratory distress.     Breath sounds: Normal breath sounds.  Abdominal:     General: Abdomen is flat.     Palpations: Abdomen is soft.     Tenderness: There is abdominal tenderness (mild, diffuse).  Musculoskeletal:     Right lower leg: No edema.     Left lower leg: No edema.     Comments: Large area of edema and erythema extending from the dorsum of the right hand to the mid forearm with overlying warmth.  Able to range at the wrist without significant additional discomfort.  Skin:    General: Skin is warm and dry.     Capillary Refill: Capillary refill takes less than 2 seconds.  Neurological:     Mental Status: He is alert and oriented to person, place, and time. Mental status is at baseline.  Psychiatric:        Mood and Affect: Mood normal.        Behavior: Behavior normal.     ED Results and Treatments Labs (all labs ordered are listed, but only abnormal results are displayed) Labs Reviewed  COMPREHENSIVE METABOLIC PANEL - Abnormal; Notable for the following components:      Result Value   Glucose, Bld 110 (*)    All other components within normal limits  CBC WITH DIFFERENTIAL/PLATELET - Abnormal; Notable for the following components:   WBC 13.0 (*)    Neutro Abs 9.1 (*)    Monocytes Absolute 1.5 (*)    All other components within normal limits  URINALYSIS, ROUTINE W REFLEX MICROSCOPIC - Abnormal; Notable for the following components:   Color, Urine COLORLESS (*)    All other components within normal limits  GASTROINTESTINAL PANEL BY PCR, STOOL (REPLACES STOOL CULTURE)   C DIFFICILE QUICK SCREEN W PCR REFLEX    LIPASE, BLOOD  OCCULT BLOOD X 1 CARD TO LAB, STOOL                                                                                                                          Radiology DG Wrist Complete Right  Result Date: 04/07/2022 CLINICAL DATA:  Cat bite, swollen hand EXAM: RIGHT WRIST - COMPLETE 3+ VIEW COMPARISON:  None Available. FINDINGS: Osteopenia. There is no evidence of fracture or dislocation. There is no evidence of arthropathy or other focal bone abnormality. Diffuse soft tissue edema about the wrist. Vascular calcinosis. IMPRESSION: 1. Osteopenia. No fracture or dislocation of the right wrist. 2. Diffuse soft tissue edema about the wrist. Electronically Signed   By: Delanna Ahmadi M.D.   On: 04/07/2022 13:20   CT  Abdomen Pelvis W Contrast  Result Date: 04/07/2022 CLINICAL DATA:  Abdominal pain EXAM: CT ABDOMEN AND PELVIS WITH CONTRAST TECHNIQUE: Multidetector CT imaging of the abdomen and pelvis was performed using the standard protocol following bolus administration of intravenous contrast. RADIATION DOSE REDUCTION: This exam was performed according to the departmental dose-optimization program which includes automated exposure control, adjustment of the mA and/or kV according to patient size and/or use of iterative reconstruction technique. CONTRAST:  OMNIPAQUE IOHEXOL 300 MG/ML  SOLN COMPARISON:  None FINDINGS: Lower chest: Visualized lower lung fields are clear. Coronary artery calcifications are seen. Hepatobiliary: There is fatty infiltration in liver. There is no dilation of bile ducts. Gallbladder is unremarkable. Pancreas: No focal abnormalities are seen. Spleen: Unremarkable. Adrenals/Urinary Tract: Adrenals are unremarkable. There is no hydronephrosis. There are scattered calcifications in renal artery branches. There is 2 mm calcific density in the upper pole of left kidney, possibly a small renal stone. There is 1.8 cm cyst in  the upper pole of left kidney. There are other possible subcentimeter left renal cysts. Ureters are unremarkable. Urinary bladder is unremarkable. Stomach/Bowel: Stomach is unremarkable. Small bowel loops are not dilated. Appendix is not dilated. There is possible small appendicoliths. There is no significant wall thickening in colon. There is no pericolic stranding. Scattered diverticula are seen in colon without definite evidence of focal acute diverticulitis. Vascular/Lymphatic: Extensive arterial calcifications are seen. No significant lymphadenopathy is seen. Reproductive: Unremarkable. Other: There is no ascites or pneumoperitoneum. Musculoskeletal: No acute findings are seen. IMPRESSION: There is no evidence of intestinal obstruction or pneumoperitoneum. Appendix is not dilated. There is no hydronephrosis. Fatty liver. Left renal cysts. Possible 2 mm left renal calculus. Diverticulosis of colon. Arteriosclerosis. Other findings as described in the body of the report. Electronically Signed   By: Ernie Avena M.D.   On: 04/07/2022 13:17    Pertinent labs & imaging results that were available during my care of the patient were reviewed by me and considered in my medical decision making (see MDM for details).  Medications Ordered in ED Medications  Ampicillin-Sulbactam (UNASYN) 3 g in sodium chloride 0.9 % 100 mL IVPB (0 g Intravenous Stopped 04/07/22 1317)  ondansetron (ZOFRAN) injection 4 mg (4 mg Intravenous Not Given 04/07/22 1206)  sodium chloride 0.9 % bolus 1,000 mL (0 mLs Intravenous Stopped 04/07/22 1330)  iohexol (OMNIPAQUE) 300 MG/ML solution 100 mL (100 mLs Intravenous Contrast Given 04/07/22 1257)                                                                                                                                     Procedures .1-3 Lead EKG Interpretation  Performed by: Lonell Grandchild, MD Authorized by: Lonell Grandchild, MD     Interpretation: normal      ECG rate:  95   ECG rate assessment: normal     Rhythm: sinus rhythm     Ectopy: none  Conduction: normal     (including critical care time)  Medical Decision Making / ED Course   MDM:  88 year old male presenting to the emergency department after cat bite injury.  Cellulitis evident on exam at the site of cat bite.  Patient reports he has not had an x-ray so will obtain x-ray to evaluate for foreign body.  Unclear if patient has been getting any antibiotic absorbed into his systemic circulation as he reportedly has vomiting every time he takes it.  Patient technically failed outpatient antibiotics and will likely need to be admitted for IV antibiotics as it appears to be worsening.  There is a line where it was demarcated at his facility and it appears worsening.  Patient also with diarrhea.  Differential includes C. difficile, antibiotic induced diarrhea, medication reaction, intra-abdominal infection, abscess, pancreatitis, appendicitis, diverticulitis..,  Given age and tenderness will obtain CT scan to further evaluate.  Clinical Course as of 04/07/22 1648  Sat Apr 07, 2022  1326 CT scan and x-ray risk of both interpreted by me.  No evidence of foreign body in the wrist.  CT abdomen with no evidence of intra-abdominal process. [WS]  1354 Discussed with Dr. Janee Mornhompson who accepted the patient for admission to [facility]. [WS]    Clinical Course User Index [WS] Lonell GrandchildScheving, Duriel Deery L, MD     Additional history obtained: -External records from outside source obtained and reviewed including: Chart review including previous notes, labs, imaging, consultation notes including office visit 10/12   Lab Tests: -I ordered, reviewed, and interpreted labs.   The pertinent results include:   Labs Reviewed  COMPREHENSIVE METABOLIC PANEL - Abnormal; Notable for the following components:      Result Value   Glucose, Bld 110 (*)    All other components within normal limits  CBC WITH  DIFFERENTIAL/PLATELET - Abnormal; Notable for the following components:   WBC 13.0 (*)    Neutro Abs 9.1 (*)    Monocytes Absolute 1.5 (*)    All other components within normal limits  URINALYSIS, ROUTINE W REFLEX MICROSCOPIC - Abnormal; Notable for the following components:   Color, Urine COLORLESS (*)    All other components within normal limits  GASTROINTESTINAL PANEL BY PCR, STOOL (REPLACES STOOL CULTURE)  C DIFFICILE QUICK SCREEN W PCR REFLEX    LIPASE, BLOOD  OCCULT BLOOD X 1 CARD TO LAB, STOOL    Notable for elevated WBC count  EKG   EKG Interpretation  Date/Time:  Saturday April 07 2022 11:29:13 EDT Ventricular Rate:  83 PR Interval:  188 QRS Duration: 160 QT Interval:  432 QTC Calculation: 508 R Axis:   53 Text Interpretation: Sinus rhythm Ventricular premature complex IVCD, consider atypical LBBB Confirmed by Alvino BloodScheving, Emmette Katt ([id number]) on 04/07/2022 1:06:39 PM         Imaging Studies ordered: I ordered imaging studies including XR wrist, CT abd On my interpretation imaging demonstrates no acute process I independently visualized and interpreted imaging. I agree with the radiologist interpretation   Medicines ordered and prescription drug management: Meds ordered this encounter  Medications   Ampicillin-Sulbactam (UNASYN) 3 g in sodium chloride 0.9 % 100 mL IVPB    Order Specific Question:   Antibiotic Indication:    Answer:   Wound Infection   sodium chloride 0.9 % bolus 1,000 mL   ondansetron (ZOFRAN) injection 4 mg   iohexol (OMNIPAQUE) 300 MG/ML solution 100 mL    -I have reviewed the patients home medicines and have made adjustments as  needed   Consultations Obtained: I requested consultation with the hospitalist,  and discussed lab and imaging findings as well as pertinent plan - they recommend: admission   Cardiac Monitoring: The patient was maintained on a cardiac monitor.  I personally viewed and interpreted the cardiac monitored which  showed an underlying rhythm of: NSR   Reevaluation: After the interventions noted above, I reevaluated the patient and found that they have improved  Co morbidities that complicate the patient evaluation  Past Medical History:  Diagnosis Date   Anxiety    AV block, 1st degree 08/06/07   Back pain    Cellulitis of lower extremity 01/11/2015   Coccygodynia 01/31/10   Coronary artery disease    Diverticulosis 1980   Dupuytren contracture 08/06/07   4th finger left hand   Essential hypertension, malignant    Gastric ulcer 12/30/01   Hearing loss 01/16/05   Hyperhidrosis, focal, primary 08/06/07   Hyperlipemia    Insomnia    Internal thrombosed hemorrhoids 04/30/01   Keratosis, actinic 08/03/09   Slowing of urinary stream 08/04/07   Vitamin D deficiency 05/27/09      Dispostion: Disposition decision including need for hospitalization was considered, and patient admitted to the hospital.    Final Clinical Impression(s) / ED Diagnoses Final diagnoses:  Cellulitis, unspecified cellulitis site     This chart was dictated using voice recognition software.  Despite best efforts to proofread,  errors can occur which can change the documentation meaning.    Lonell Grandchild, MD 04/07/22 [phone]

## 2022-04-07 NOTE — Care Plan (Signed)
Was called by ED physician from [facility] to admit this patient Samuel Clayton, pleasant 88 year old gentleman history of CAD, hypertension, hyperlipidemia presented to the ED with worsening right hand pain and swelling as well as nausea vomiting and diarrhea.  Patient noted to have been seen by nurse practitioner at assisted living facility on 04/05/2022 where he was assessed for cat bite on 04/03/2022 by family cat noted to have 2 small puncture wounds to the right wrist with increasing erythema and swelling to the right hand extending to the forearm.  Patient prescribed Augmentin which was unable to tolerate as he developed nausea vomiting and diarrhea and only took 4 pills total. Patient presenting back to [facility] ED with worsening right upper extremity swelling, erythema, cellulitis as well as GI symptoms.  CT abdomen and pelvis done unremarkable.  Plain films of the right wrist which was done with osteopenia, no fracture or dislocation of the right wrist, soft tissue edema about the right wrist.  Patient given IV Unasyn.  Hospitalist called for admission due to failed outpatient treatment due to GI symptoms.  Patient accepted to MedSurg bed.  May need CT of the right upper extremity to rule out any further complications from cat bite.  May need hand surgery input.  No charge.

## 2022-04-07 NOTE — H&P (Signed)
History and Physical    Patient: Samuel Clayton PJA:[id number] DOB: 01/31/1926 DOA: 04/07/2022 DOS: the patient was seen and examined on 04/07/2022 PCP: Mahlon Gammon, MD  Patient coming from: Home  Chief Complaint:  Right hand swelling N/V  HPI: Samuel Clayton is a 88 y.o. male with medical history significant of anxiety, HLD, HTN. Presenting with swelling of his right hand. He reports that 4 days ago he was bitten by a cat. He was ok until 2 days ago when he noticed redness and swelling of his right hand. He didn't have any fever. There was no drainage from the site. It was only mildly tender. However, he became concerned and went to his PCP. He was given a prescription for augmentin, which he started that day. However, it made him extremely nauseous and he started having diarrhea. He hasn't been able to keep the medicine down. So when his symptoms did not improve today, he decided to come to the ED for evaluation. He denies any other aggravating or alleviating factors.    Review of Systems: As mentioned in the history of present illness. All other systems reviewed and are negative. Past Medical History:  Diagnosis Date   Anxiety    AV block, 1st degree 08/06/07   Back pain    Cellulitis of lower extremity 01/11/2015   Coccygodynia 01/31/10   Coronary artery disease    Diverticulosis 1980   Dupuytren contracture 08/06/07   4th finger left hand   Essential hypertension, malignant    Gastric ulcer 12/30/01   Hearing loss 01/16/05   Hyperhidrosis, focal, primary 08/06/07   Hyperlipemia    Insomnia    Internal thrombosed hemorrhoids 04/30/01   Keratosis, actinic 08/03/09   Slowing of urinary stream 08/04/07   Vitamin D deficiency 05/27/09   Past Surgical History:  Procedure Laterality Date   CATARACT EXTRACTION W/ INTRAOCULAR LENS IMPLANT Right 1990   Dr. Emmit Pomfret   CATARACT EXTRACTION W/ INTRAOCULAR LENS IMPLANT Left 1992   Dr. Emmit Pomfret   COLONOSCOPY  2003   Diverticulosis   CORONARY ARTERY  BYPASS GRAFT  1998   Dr Andrey Campanile at [facility]   RECTAL POLYPECTOMY  1971   Dr. Elesa Hacker   Social History:  reports that he quit smoking about 44 years ago. His smoking use included cigars. He has never used smokeless tobacco. He reports current alcohol use. He reports that he does not use drugs.  Allergies  Allergen Reactions   Furosemide Itching   Orange Juice [Orange Oil] Other (See Comments)    indegestion    Family History  Problem Relation Age of Onset   Heart disease Mother    Heart disease Father    Parkinson's disease Father    Diabetes Sister    Kidney disease Sister    Heart disease Sister    Lung disease Brother    Diabetes Sister    Cerebrovascular Accident Sister     Prior to Admission medications   Medication Sig Start Date End Date Taking? Authorizing Provider  aspirin 81 MG tablet Take 81 mg by mouth daily.   Yes [provider]  cetirizine (ZYRTEC) 10 MG tablet TAKE 1 TABLET DAILY Patient taking differently: Take 10 mg by mouth daily. 08/15/21  Yes Mahlon Gammon, MD  Cholecalciferol (VITAMIN D3) 50 MCG (2000 UT) TABS Take 1 tablet by mouth daily.   Yes [provider]  famotidine (PEPCID) 20 MG tablet TAKE 1 TABLET DAILY Patient taking differently: Take 20 mg by  mouth daily. 08/15/21  Yes Virgie Dad, MD  metoprolol tartrate (LOPRESSOR) 50 MG tablet TAKE 1 TABLET TWICE A DAY Patient taking differently: Take 50 mg by mouth 2 (two) times daily. 07/13/21  Yes Virgie Dad, MD  Propylene Glycol (SYSTANE BALANCE) 0.6 % SOLN Apply 1 drop to eye as needed. Both eyes Patient taking differently: Apply 1 drop to eye as needed. Both eyes dry eyes 07/20/20  Yes Virgie Dad, MD  torsemide (DEMADEX) 20 MG tablet TAKE ONE-HALF TABLET (10 MG) AS NEEDED Patient taking differently: Take 10 mg by mouth daily as needed (fluid retention). TAKE ONE-HALF TABLET (10 MG) AS NEEDED 10/09/21  Yes Virgie Dad, MD  triamcinolone cream (KENALOG) 0.1 % APPLY 1  APPLICATION TOPICALLY AS NEEDED Patient taking differently: Apply 1 application  topically as needed (itching). 08/15/21  Yes Virgie Dad, MD  amoxicillin-clavulanate (AUGMENTIN) 875-125 MG tablet Take 1 tablet by mouth 2 (two) times daily. Patient not taking: Reported on 04/07/2022 04/05/22   Yvonna Alanis, NP  clobetasol cream (TEMOVATE) 2.35 % Apply 1 application topically 2 (two) times daily. Patient not taking: Reported on 04/07/2022 12/07/20   Virgie Dad, MD  rosuvastatin (CRESTOR) 10 MG tablet TAKE 1 TABLET DAILY TO LOWER CHOLESTEROL Patient not taking: Reported on 04/07/2022 07/17/21   Virgie Dad, MD  potassium chloride (K-DUR) 10 MEQ tablet Take 1 tablet (10 mEq total) by mouth daily as needed (take with torsemide). 12/07/18 11/09/19  Virgie Dad, MD    Physical Exam: Vitals:   04/07/22 1320 04/07/22 1330 04/07/22 1400 04/07/22 1529  BP: (!) 154/80 (!) 138/124 (!) 152/85 (!) 165/77  Pulse: 75 80  82  Resp: 15 14 14 18   Temp:    98 F (36.7 C)  TempSrc:    Oral  SpO2: 96% 97%  97%  Weight:      Height:       General: 88 y.o. male resting in bed in NAD Eyes: PERRL, normal sclera ENMT: Nares patent w/o discharge, orophaynx clear, dentition normal, ears w/o discharge/lesions/ulcers Neck: Supple, trachea midline Cardiovascular: RRR, +S1, S2, no m/g/r, equal pulses throughout Respiratory: CTABL, no w/r/r, normal WOB GI: BS+, NDNT, no masses noted, no organomegaly noted MSK: No c/c; edema and erythema of the right hand, his is able to flex and extend all digits w/o assistance w/ good ROM, distal pulses ok Neuro: A&O x 3, no focal deficits Psyc: Appropriate interaction and affect, calm/cooperative  Data Reviewed:  Results for orders placed or performed during the hospital encounter of 04/07/22 (from the past 24 hour(s))  Comprehensive metabolic panel     Status: Abnormal   Collection Time: 04/07/22 11:32 AM  Result Value Ref Range   Sodium 135 135 - 145 mmol/L    Potassium 3.9 3.5 - 5.1 mmol/L   Chloride 103 98 - 111 mmol/L   CO2 23 22 - 32 mmol/L   Glucose, Bld 110 (H) 70 - 99 mg/dL   BUN 12 8 - 23 mg/dL   Creatinine, Ser 1.03 0.61 - 1.24 mg/dL   Calcium 9.3 8.9 - 10.3 mg/dL   Total Protein 7.1 6.5 - 8.1 g/dL   Albumin 3.8 3.5 - 5.0 g/dL   AST 19 15 - 41 U/L   ALT 19 0 - 44 U/L   Alkaline Phosphatase 59 38 - 126 U/L   Total Bilirubin 0.7 0.3 - 1.2 mg/dL   GFR, Estimated >60 >60 mL/min   Anion gap 9 5 -  15  CBC with Differential     Status: Abnormal   Collection Time: 04/07/22 11:32 AM  Result Value Ref Range   WBC 13.0 (H) 4.0 - 10.5 K/uL   RBC 4.40 4.22 - 5.81 MIL/uL   Hemoglobin 14.3 13.0 - 17.0 g/dL   HCT 37.6 28.3 - 15.1 %   MCV 97.0 80.0 - 100.0 fL   MCH 32.5 26.0 - 34.0 pg   MCHC 33.5 30.0 - 36.0 g/dL   RDW 76.1 60.7 - 37.1 %   Platelets 159 150 - 400 K/uL   nRBC 0.0 0.0 - 0.2 %   Neutrophils Relative % 68 %   Neutro Abs 9.1 (H) 1.7 - 7.7 K/uL   Lymphocytes Relative 16 %   Lymphs Abs 2.1 0.7 - 4.0 K/uL   Monocytes Relative 12 %   Monocytes Absolute 1.5 (H) 0.1 - 1.0 K/uL   Eosinophils Relative 2 %   Eosinophils Absolute 0.2 0.0 - 0.5 K/uL   Basophils Relative 1 %   Basophils Absolute 0.1 0.0 - 0.1 K/uL   Immature Granulocytes 1 %   Abs Immature Granulocytes 0.07 0.00 - 0.07 K/uL  Lipase, blood     Status: None   Collection Time: 04/07/22 11:32 AM  Result Value Ref Range   Lipase 14 11 - 51 U/L  Urinalysis, Routine w reflex microscopic Urine, Clean Catch     Status: Abnormal   Collection Time: 04/07/22  2:00 PM  Result Value Ref Range   Color, Urine COLORLESS (A) YELLOW   APPearance CLEAR CLEAR   Specific Gravity, Urine 1.021 1.005 - 1.030   pH 6.5 5.0 - 8.0   Glucose, UA NEGATIVE NEGATIVE mg/dL   Hgb urine dipstick NEGATIVE NEGATIVE   Bilirubin Urine NEGATIVE NEGATIVE   Ketones, ur NEGATIVE NEGATIVE mg/dL   Protein, ur NEGATIVE NEGATIVE mg/dL   Nitrite NEGATIVE NEGATIVE   Leukocytes,Ua NEGATIVE NEGATIVE   CT  ab/pelvis There is no evidence of intestinal obstruction or pneumoperitoneum. Appendix is not dilated. There is no hydronephrosis. Fatty liver. Left renal cysts. Possible 2 mm left renal calculus. Diverticulosis of colon. Arteriosclerosis. Other findings as described in the body of the report.  XR Hand 1. Osteopenia. No fracture or dislocation of the right wrist. 2. Diffuse soft tissue edema about the wrist.  Assessment and Plan: Cellulitis of right hand Cat bite     - admit to inpt, med-surg     - continue unasyn     - he has good ROM, good pulses     - check CT hand   Diarrhea N/V     - he had diarrhea after starting augmentin     - c diff screen ordered in ED     - can d/c if no BM ON     - compazine for N  HTN     - continue home regimen  HLD CAD     - continue home regimen  GERD     - hold home regimen until c diff r/o'd   Advance Care Planning:   Code Status: DNR confirmed w/ pt through multiple lines of questioning  Consults: None  Family Communication: None at bedside  Severity of Illness: The appropriate patient status for this patient is INPATIENT. Inpatient status is judged to be reasonable and necessary in order to provide the required intensity of service to ensure the patient's safety. The patient's presenting symptoms, physical exam findings, and initial radiographic and laboratory data in the context of  their chronic comorbidities is felt to place them at high risk for further clinical deterioration. Furthermore, it is not anticipated that the patient will be medically stable for discharge from the hospital within 2 midnights of admission.   * I certify that at the point of admission it is my clinical judgment that the patient will require inpatient hospital care spanning beyond 2 midnights from the point of admission due to high intensity of service, high risk for further deterioration and high frequency of surveillance required.*  Author: Teddy Spike, DO 04/07/2022 4:31 PM  For on call review [email or web address].

## 2022-04-07 NOTE — ED Triage Notes (Signed)
Pt states went to the doctor 2 days ago about a cat bite and was prescribed Amoxicillin 875-125 mg tab which has been giving him diarrhea and vomiting when taking medication. Took 4 tablets and stopped taking them.

## 2022-04-08 ENCOUNTER — Inpatient Hospital Stay (HOSPITAL_COMMUNITY): Payer: Medicare HMO

## 2022-04-08 DIAGNOSIS — R112 Nausea with vomiting, unspecified: Secondary | ICD-10-CM

## 2022-04-08 DIAGNOSIS — W5501XA Bitten by cat, initial encounter: Secondary | ICD-10-CM | POA: Diagnosis not present

## 2022-04-08 DIAGNOSIS — L03113 Cellulitis of right upper limb: Secondary | ICD-10-CM

## 2022-04-08 LAB — COMPREHENSIVE METABOLIC PANEL
ALT: 21 U/L (ref 0–44)
AST: 23 U/L (ref 15–41)
Albumin: 3 g/dL — ABNORMAL LOW (ref 3.5–5.0)
Alkaline Phosphatase: 58 U/L (ref 38–126)
Anion gap: 6 (ref 5–15)
BUN: 15 mg/dL (ref 8–23)
CO2: 24 mmol/L (ref 22–32)
Calcium: 8.4 mg/dL — ABNORMAL LOW (ref 8.9–10.3)
Chloride: 110 mmol/L (ref 98–111)
Creatinine, Ser: 0.92 mg/dL (ref 0.61–1.24)
GFR, Estimated: >60 mL/min (ref >60)
Glucose, Bld: 96 mg/dL (ref 70–99)
Potassium: 3.8 mmol/L (ref 3.5–5.1)
Sodium: 140 mmol/L (ref 135–145)
Total Bilirubin: 0.7 mg/dL (ref 0.3–1.2)
Total Protein: 6.1 g/dL — ABNORMAL LOW (ref 6.5–8.1)

## 2022-04-08 LAB — CBC
HCT: 41.1 % (ref 39.0–52.0)
Hemoglobin: 13.2 g/dL (ref 13.0–17.0)
MCH: 31.8 pg (ref 26.0–34.0)
MCHC: 32.1 g/dL (ref 30.0–36.0)
MCV: 99 fL (ref 80.0–100.0)
Platelets: 165 10*3/uL (ref 150–400)
RBC: 4.15 MIL/uL — ABNORMAL LOW (ref 4.22–5.81)
RDW: 12.7 % (ref 11.5–15.5)
WBC: 9.2 10*3/uL (ref 4.0–10.5)
nRBC: 0 % (ref 0.0–0.2)

## 2022-04-08 MED ORDER — ROSUVASTATIN CALCIUM 10 MG PO TABS
10.0000 mg | ORAL_TABLET | Freq: Every day | ORAL | Status: DC
  Administered 2022-04-08 – 2022-04-09 (×2): 10 mg via ORAL
  Filled 2022-04-08 (×2): qty 1

## 2022-04-08 MED ORDER — TETANUS-DIPHTH-ACELL PERTUSSIS 5-2.5-18.5 LF-MCG/0.5 IM SUSY
0.5000 mL | PREFILLED_SYRINGE | Freq: Once | INTRAMUSCULAR | Status: AC
  Administered 2022-04-08: 0.5 mL via INTRAMUSCULAR
  Filled 2022-04-08: qty 0.5

## 2022-04-08 MED ORDER — IOHEXOL 300 MG/ML  SOLN
100.0000 mL | Freq: Once | INTRAMUSCULAR | Status: AC | PRN
  Administered 2022-04-08: 100 mL via INTRAVENOUS

## 2022-04-08 NOTE — Hospital Course (Signed)
88 year old man presented to ED with swelling of right hand. Seen by PCP 10/12 for cat bite, started on Augmentin, developed diarrhea and vomiting after starting.  Admitted for cellulitis secondary to cat bite.

## 2022-04-08 NOTE — Plan of Care (Signed)
  Problem: Education: Goal: Knowledge of General Education information will improve Description: Including pain rating scale, medication(s)/side effects and non-pharmacologic comfort measures Outcome: Progressing   Problem: Coping: Goal: Level of anxiety will decrease Outcome: Progressing   

## 2022-04-08 NOTE — Progress Notes (Addendum)
  Progress Note   Patient: Samuel Clayton ZTI:[id number] DOB: [birth year] DOA: 04/07/2022     1 DOS: the patient was seen and examined on 04/08/2022   Brief hospital course: 88 year old man presented to ED with swelling of right hand. Seen by PCP 10/12 for cat bite, started on Augmentin, developed diarrhea and vomiting after starting.  Admitted for cellulitis secondary to cat bite.  Assessment and Plan: Cellulitis of right hand secondary to cat bite --improving subjectively and exam reasurring, CT pending --continue IV abx --administer tetanus   Diarrhea, N/V --began after starting Augmentin, likely ADR --diarrhea resolved; no s/s to suggest cdiff, no need to test --n/v resolved --supportive care   Essential HTN, CAD --continue metoprolol, Crestor, ASA. Takes torsemide PRN      Subjective:  Overnight Events None charted  Feels better today, better ROM in hand, less edema, less redness. No diarrhea or vomiting today.  Physical Exam: Vitals:   04/07/22 1716 04/07/22 2115 04/08/22 0417 04/08/22 0828  BP: (!) 170/76 (!) 146/74 (!) 148/75 139/62  Pulse: 78 82 69 71  Resp: 14 19 16 16   Temp: 98.3 F (36.8 C) 97.7 F (36.5 C) 98.4 F (36.9 C) 97.6 F (36.4 C)  TempSrc: Oral Oral Oral Oral  SpO2: 97% 91% 95% 94%  Weight:      Height:       Physical Exam Vitals reviewed.  Constitutional:      General: He is not in acute distress.    Appearance: He is not ill-appearing or toxic-appearing.  Cardiovascular:     Rate and Rhythm: Normal rate and regular rhythm.     Heart sounds: No murmur heard. Pulmonary:     Effort: Pulmonary effort is normal. No respiratory distress.     Breath sounds: No wheezing, rhonchi or rales.  Skin:    Comments: Minimal erythema right lower arm, hand; moderate edema of hand. Strong radial pulse. Perfusion of had appears intact. No fluctuance or pain with palpation.  Neurological:     Mental Status: He is alert.  Psychiatric:        Mood and  Affect: Mood normal.        Behavior: Behavior normal.     Data Reviewed: CMP noted CBC WNL  Family Communication: none present  Disposition: Status is: Inpatient Remains inpatient appropriate because: hand infection, unable to tolerate oral abx  Planned Discharge Destination: Home    Time spent: 35 minutes  Author: Murray Hodgkins, MD 04/08/2022 11:40 AM  For on call review [email or web address].

## 2022-04-09 DIAGNOSIS — R197 Diarrhea, unspecified: Secondary | ICD-10-CM | POA: Diagnosis not present

## 2022-04-09 DIAGNOSIS — W5501XA Bitten by cat, initial encounter: Secondary | ICD-10-CM | POA: Diagnosis not present

## 2022-04-09 DIAGNOSIS — L03113 Cellulitis of right upper limb: Secondary | ICD-10-CM | POA: Diagnosis not present

## 2022-04-09 MED ORDER — DOXYCYCLINE HYCLATE 100 MG PO TABS
100.0000 mg | ORAL_TABLET | Freq: Two times a day (BID) | ORAL | Status: DC
  Administered 2022-04-09 – 2022-04-10 (×3): 100 mg via ORAL
  Filled 2022-04-09 (×3): qty 1

## 2022-04-09 MED ORDER — METRONIDAZOLE 500 MG PO TABS
500.0000 mg | ORAL_TABLET | Freq: Three times a day (TID) | ORAL | Status: DC
  Administered 2022-04-09 – 2022-04-10 (×3): 500 mg via ORAL
  Filled 2022-04-09 (×3): qty 1

## 2022-04-09 MED ORDER — POLYETHYLENE GLYCOL 3350 17 G PO PACK: 17.0000 g | PACK | Freq: Two times a day (BID) | ORAL | Status: DC

## 2022-04-09 NOTE — Progress Notes (Signed)
Mobility Specialist - Progress Note Pt refused mobility, just got comfortable in bed from bathroom, will check back in if schedule permits.  Roderick Pee Mobility Specialist

## 2022-04-09 NOTE — Progress Notes (Signed)
  Progress Note   Patient: Samuel Clayton KDX:[id number] DOB: Jun 13, 1926 DOA: 04/07/2022     2 DOS: the patient was seen and examined on 04/09/2022   Brief hospital course: 88 year old man presented to ED with swelling of right hand. Seen by PCP 10/12 for cat bite, started on Augmentin, developed diarrhea and vomiting after starting.  Admitted for cellulitis secondary to cat bite.  Assessment and Plan: Cellulitis of right hand secondary to cat bite --continues to improve, CT without complicating features --tolerating Unasyn (severe diarrhea w/ Augmentin); will change to oral doxy + metronidazole. If tolerates, then release to [facility]. --s/p tetanus 10/15   Diarrhea, N/V --began after starting Augmentin, likely ADR --diarrhea resolved; no s/s to suggest cdiff, no need to test  Essential HTN, CAD --stable. Continue metoprolol, Crestor, ASA. Takes torsemide PRN      Subjective:  Feels much better  Physical Exam: Vitals:   04/08/22 0828 04/08/22 1246 04/08/22 2020 04/09/22 0517  BP: 139/62 130/73 (!) 142/63 129/77  Pulse: 71 70 79 80  Resp: 16 18 18 18   Temp: 97.6 F (36.4 C) 98 F (36.7 C) 97.9 F (36.6 C) 97.6 F (36.4 C)  TempSrc: Oral  Oral Oral  SpO2: 94% 96% 93% 95%  Weight:      Height:       Physical Exam Vitals reviewed.  Constitutional:      General: He is not in acute distress.    Appearance: He is ill-appearing. He is not toxic-appearing.  Cardiovascular:     Rate and Rhythm: Normal rate and regular rhythm.     Heart sounds: No murmur heard. Pulmonary:     Effort: Pulmonary effort is normal. No respiratory distress.     Breath sounds: No wheezing, rhonchi or rales.  Skin:    Comments: Right lower arm and hand much improved, decreased edema, erythema appears resolved, no tenderness, no fluctuance.  Neurological:     Mental Status: He is alert.  Psychiatric:        Mood and Affect: Mood normal.        Behavior: Behavior normal.     Data  Reviewed: No new data  Family Communication: none  Disposition: Status is: Inpatient Remains inpatient appropriate because: right hand infection  Planned Discharge Destination: Home    Time spent: 20 minutes  Author: Murray Hodgkins, MD 04/09/2022 12:44 PM  For on call review [email or web address].

## 2022-04-10 DIAGNOSIS — W5501XA Bitten by cat, initial encounter: Secondary | ICD-10-CM | POA: Diagnosis not present

## 2022-04-10 DIAGNOSIS — L03113 Cellulitis of right upper limb: Secondary | ICD-10-CM | POA: Diagnosis not present

## 2022-04-10 MED ORDER — METRONIDAZOLE 500 MG PO TABS: 500.0000 mg | ORAL_TABLET | Freq: Three times a day (TID) | ORAL | 0 refills | Status: AC

## 2022-04-10 MED ORDER — DOXYCYCLINE HYCLATE 100 MG PO TABS: 100.0000 mg | ORAL_TABLET | Freq: Two times a day (BID) | ORAL | 0 refills | Status: AC

## 2022-04-10 NOTE — Discharge Summary (Signed)
Physician Discharge Summary   Patient: Samuel Clayton MRN: [id number] DOB: 1925-10-15  Admit date:     04/07/2022  Discharge date: 04/10/22  Discharge Physician: Brendia Sacks   PCP: Mahlon Gammon, MD   Recommendations at discharge:  Resolution of right hand cellulitis  Discharge Diagnoses: Principal Problem:   Cat bite Active Problems:   CAD (coronary artery disease)   HLD (hyperlipidemia)   HTN (hypertension)   GERD (gastroesophageal reflux disease)   Cellulitis of right hand   Diarrhea   N&V (nausea and vomiting)  Resolved Problems:   * No resolved hospital problems. *  Hospital Course: 88 year old man presented to ED with swelling of right hand. Seen by PCP 10/12 for cat bite, started on Augmentin, developed diarrhea and vomiting after starting.  Admitted for cellulitis secondary to cat bite. Rapidly improved on abx, transitioned to oral, tolerated.  Hospitalization uncomplicated.  Cellulitis of right hand secondary to cat bite --continues to improve, CT without complicating features --tolerating Unasyn (severe diarrhea w/ Augmentin); not tolerating oral doxy + metronidazole.  --s/p tetanus 10/15   Diarrhea, N/V --began after starting Augmentin, likely ADR --diarrhea resolved; no s/s to suggest cdiff, no need to test   Essential HTN, CAD --remains stable. Will continue metoprolol, Crestor, ASA. Takes torsemide PRN  Consultants:  None  Procedures performed:  None   Disposition: Home Diet recommendation:  Regular diet DISCHARGE MEDICATION: Allergies as of 04/10/2022       Reactions   Furosemide Itching   Orange Juice [orange Oil] Other (See Comments)   indegestion        Medication List     STOP taking these medications    amoxicillin-clavulanate 875-125 MG tablet Commonly known as: AUGMENTIN   clobetasol cream 0.05 % Commonly known as: TEMOVATE       TAKE these medications    aspirin 81 MG tablet Take 81 mg by mouth daily.    cetirizine 10 MG tablet Commonly known as: ZYRTEC TAKE 1 TABLET DAILY   doxycycline 100 MG tablet Commonly known as: VIBRA-TABS Take 1 tablet (100 mg total) by mouth every 12 (twelve) hours for 7 days.   famotidine 20 MG tablet Commonly known as: PEPCID TAKE 1 TABLET DAILY   metoprolol tartrate 50 MG tablet Commonly known as: LOPRESSOR TAKE 1 TABLET TWICE A DAY   metroNIDAZOLE 500 MG tablet Commonly known as: FLAGYL Take 1 tablet (500 mg total) by mouth every 8 (eight) hours for 7 days.   rosuvastatin 10 MG tablet Commonly known as: CRESTOR TAKE 1 TABLET DAILY TO LOWER CHOLESTEROL   Systane Balance 0.6 % Soln Generic drug: Propylene Glycol Apply 1 drop to eye as needed. Both eyes What changed: additional instructions   triamcinolone cream 0.1 % Commonly known as: KENALOG APPLY 1 APPLICATION TOPICALLY AS NEEDED What changed: reasons to take this   Vitamin D3 50 MCG (2000 UT) Tabs Take 1 tablet by mouth daily.        Follow-up Information     Mahlon Gammon, MD Follow up.   Specialty: Internal Medicine Why: As needed Contact information: [address] [place] [address] [phone]                Feels better  No pain   Discharge Exam: Filed Weights   04/07/22 0946  Weight: 95.1 kg   Physical Exam Vitals reviewed.  Constitutional:      General: He is not in acute distress.    Appearance: He is not  ill-appearing or toxic-appearing.  Cardiovascular:     Rate and Rhythm: Normal rate and regular rhythm.     Heart sounds: No murmur heard. Pulmonary:     Effort: Pulmonary effort is normal. No respiratory distress.     Breath sounds: No wheezing, rhonchi or rales.  Musculoskeletal:     Comments: Trace right hand, forearm edema  Skin:    Comments: Erythema resolved right hand  Neurological:     Mental Status: He is alert.  Psychiatric:        Mood and Affect: Mood normal.        Behavior: Behavior normal.      Condition at  discharge: good  The results of significant diagnostics from this hospitalization (including imaging, microbiology, ancillary and laboratory) are listed below for reference.   Imaging Studies: CT Extrem Up Entire Arm L W/CM  Result Date: 04/08/2022 CLINICAL DATA:  Soft tissue infection suspected, hand, no prior imaging right hand/wrist swelling/cellulitis after cat bite EXAM: CT OF THE UPPER LEFT EXTREMITY WITH CONTRAST TECHNIQUE: Multidetector CT imaging of the left hand and wrist was performed according to the standard protocol following intravenous contrast administration. RADIATION DOSE REDUCTION: This exam was performed according to the departmental dose-optimization program which includes automated exposure control, adjustment of the mA and/or kV according to patient size and/or use of iterative reconstruction technique. CONTRAST:  155mL OMNIPAQUE IOHEXOL 300 MG/ML  SOLN COMPARISON:  X-ray 04/07/2022 FINDINGS: Bones/Joint/Cartilage No acute fracture. No dislocation. No erosion or periosteal elevation. No lytic or sclerotic bone lesion. Mild degenerative changes, most pronounced at the first MCP joint. No large joint effusions are seen. Ligaments Suboptimally assessed by CT. Muscles and Tendons No acute musculotendinous abnormality is identified. Suspect a small amount of tenosynovial fluid associated with the extensor tendons of the long, ring, and small fingers. Soft tissues Skin thickening and subcutaneous edema over the dorsum of the hand. No organized or rim enhancing fluid collection. No soft tissue gas. No radiopaque foreign body. Atherosclerotic vascular calcifications are present. IMPRESSION: 1. Skin thickening and subcutaneous edema over the dorsum of the hand, suggestive of cellulitis. No abscess, soft tissue gas, or radiopaque foreign body. 2. Suspect a small amount of tenosynovial fluid associated with the extensor tendons of the long, ring, and small fingers. 3. No acute osseous  abnormality.  No evidence of osteomyelitis. Electronically Signed   By: Davina Poke D.O.   On: 04/08/2022 11:56   DG Wrist Complete Right  Result Date: 04/07/2022 CLINICAL DATA:  Cat bite, swollen hand EXAM: RIGHT WRIST - COMPLETE 3+ VIEW COMPARISON:  None Available. FINDINGS: Osteopenia. There is no evidence of fracture or dislocation. There is no evidence of arthropathy or other focal bone abnormality. Diffuse soft tissue edema about the wrist. Vascular calcinosis. IMPRESSION: 1. Osteopenia. No fracture or dislocation of the right wrist. 2. Diffuse soft tissue edema about the wrist. Electronically Signed   By: Delanna Ahmadi M.D.   On: 04/07/2022 13:20   CT Abdomen Pelvis W Contrast  Result Date: 04/07/2022 CLINICAL DATA:  Abdominal pain EXAM: CT ABDOMEN AND PELVIS WITH CONTRAST TECHNIQUE: Multidetector CT imaging of the abdomen and pelvis was performed using the standard protocol following bolus administration of intravenous contrast. RADIATION DOSE REDUCTION: This exam was performed according to the departmental dose-optimization program which includes automated exposure control, adjustment of the mA and/or kV according to patient size and/or use of iterative reconstruction technique. CONTRAST:  171mL OMNIPAQUE IOHEXOL 300 MG/ML  SOLN COMPARISON:  None FINDINGS: Lower chest:  Visualized lower lung fields are clear. Coronary artery calcifications are seen. Hepatobiliary: There is fatty infiltration in liver. There is no dilation of bile ducts. Gallbladder is unremarkable. Pancreas: No focal abnormalities are seen. Spleen: Unremarkable. Adrenals/Urinary Tract: Adrenals are unremarkable. There is no hydronephrosis. There are scattered calcifications in renal artery branches. There is 2 mm calcific density in the upper pole of left kidney, possibly a small renal stone. There is 1.8 cm cyst in the upper pole of left kidney. There are other possible subcentimeter left renal cysts. Ureters are  unremarkable. Urinary bladder is unremarkable. Stomach/Bowel: Stomach is unremarkable. Small bowel loops are not dilated. Appendix is not dilated. There is possible small appendicoliths. There is no significant wall thickening in colon. There is no pericolic stranding. Scattered diverticula are seen in colon without definite evidence of focal acute diverticulitis. Vascular/Lymphatic: Extensive arterial calcifications are seen. No significant lymphadenopathy is seen. Reproductive: Unremarkable. Other: There is no ascites or pneumoperitoneum. Musculoskeletal: No acute findings are seen. IMPRESSION: There is no evidence of intestinal obstruction or pneumoperitoneum. Appendix is not dilated. There is no hydronephrosis. Fatty liver. Left renal cysts. Possible 2 mm left renal calculus. Diverticulosis of colon. Arteriosclerosis. Other findings as described in the body of the report. Electronically Signed   By: Ernie Avena M.D.   On: 04/07/2022 13:17    Microbiology: No results found for this or any previous visit.  Labs: CBC: Recent Labs  Lab 04/07/22 1132 04/08/22 0447  WBC 13.0* 9.2  NEUTROABS 9.1*  --   HGB 14.3 13.2  HCT 42.7 41.1  MCV 97.0 99.0  PLT 159 165   Basic Metabolic Panel: Recent Labs  Lab 04/07/22 1132 04/08/22 0447  NA 135 140  K 3.9 3.8  CL 103 110  CO2 23 24  GLUCOSE 110* 96  BUN 12 15  CREATININE 1.03 0.92  CALCIUM 9.3 8.4*   Liver Function Tests: Recent Labs  Lab 04/07/22 1132 04/08/22 0447  AST 19 23  ALT 19 21  ALKPHOS 59 58  BILITOT 0.7 0.7  PROT 7.1 6.1*  ALBUMIN 3.8 3.0*   CBG: No results for input(s): "GLUCAP" in the last 168 hours.  Discharge time spent: less than 30 minutes.  Signed: Brendia Sacks, MD [facility] 04/10/2022

## 2022-04-10 NOTE — Progress Notes (Signed)
Patient was given discharge instructions, and all questions were answered. Patient was stable for discharge and was walked to the main entrance.

## 2022-04-10 NOTE — Progress Notes (Signed)
  Transition of Care (TOC) Screening Note   Patient Details  Name: HUBBERT LANDRIGAN Date of Birth: 29-Dec-1925   Transition of Care [facility]) CM/SW Contact:    Lennart Pall, LCSW Phone Number: 04/10/2022, 11:08 AM    Transition of Care Department [facility]) has reviewed patient and no TOC needs have been identified at this time. We will continue to monitor patient advancement through interdisciplinary progression rounds. If new patient transition needs arise, please place a TOC consult.

## 2022-04-10 NOTE — Progress Notes (Signed)
Mobility Specialist - Progress Note  04/10/22 1027  Mobility  Activity Ambulated independently in hallway  Level of Assistance Independent  Assistive Device None  Distance Ambulated (ft) 250 ft  Range of Motion/Exercises Active  Activity Response Tolerated well  Mobility Referral Yes  $Mobility charge 1 Mobility   Pt received in recliner and agreeable to mobility. No complaints during mobility. Pt to recliner after session with all needs met & MD in room.  [facility]

## 2022-04-11 ENCOUNTER — Telehealth: Payer: Self-pay

## 2022-04-11 NOTE — Telephone Encounter (Signed)
Transition Care Management Unsuccessful Follow-up Telephone Call  Date of discharge and from where:  [facility], 04/10/2022  Attempts:  1st Attempt  Reason for unsuccessful TCM follow-up call:  spoke with patient states that he is doing fine and would like to just keep prior appointment already made for December. Nothing else follows at this time

## 2022-06-06 ENCOUNTER — Other Ambulatory Visit: Payer: Self-pay | Admitting: Internal Medicine

## 2022-06-06 DIAGNOSIS — R7303 Prediabetes: Secondary | ICD-10-CM

## 2022-06-06 DIAGNOSIS — I10 Essential (primary) hypertension: Secondary | ICD-10-CM

## 2022-06-06 DIAGNOSIS — E785 Hyperlipidemia, unspecified: Secondary | ICD-10-CM

## 2022-06-07 DIAGNOSIS — I10 Essential (primary) hypertension: Secondary | ICD-10-CM

## 2022-06-07 DIAGNOSIS — E785 Hyperlipidemia, unspecified: Secondary | ICD-10-CM

## 2022-06-07 DIAGNOSIS — R7303 Prediabetes: Secondary | ICD-10-CM

## 2022-06-07 LAB — CBC WITH DIFFERENTIAL/PLATELET
Absolute Monocytes: 793 cells/uL (ref 200–950)
Basophils Absolute: 62 cells/uL (ref 0–200)
Basophils Relative: 0.8 %
Eosinophils Absolute: 362 cells/uL (ref 15–500)
Eosinophils Relative: 4.7 %
HCT: 41.5 % (ref 38.5–50.0)
Hemoglobin: 14.3 g/dL (ref 13.2–17.1)
Lymphs Abs: 2379 cells/uL (ref 850–3900)
MCH: 32.3 pg (ref 27.0–33.0)
MCHC: 34.5 g/dL (ref 32.0–36.0)
MCV: 93.7 fL (ref 80.0–100.0)
MPV: 11.8 fL (ref 7.5–12.5)
Monocytes Relative: 10.3 %
Neutro Abs: 4104 cells/uL (ref 1500–7800)
Neutrophils Relative %: 53.3 %
Platelets: 194 10*3/uL (ref 140–400)
RBC: 4.43 10*6/uL (ref 4.20–5.80)
RDW: 11.9 % (ref 11.0–15.0)
Total Lymphocyte: 30.9 %
WBC: 7.7 10*3/uL (ref 3.8–10.8)

## 2022-06-07 LAB — LIPID PANEL
Cholesterol: 232 mg/dL — ABNORMAL HIGH (ref <200)
HDL: 44 mg/dL (ref >=40)
LDL Cholesterol (Calc): 154 mg/dL (calc) — ABNORMAL HIGH
Non-HDL Cholesterol (Calc): 188 mg/dL (calc) — ABNORMAL HIGH (ref <130)
Total CHOL/HDL Ratio: 5.3 (calc) — ABNORMAL HIGH (ref <5.0)
Triglycerides: 196 mg/dL — ABNORMAL HIGH (ref <150)

## 2022-06-07 LAB — COMPLETE METABOLIC PANEL WITH GFR
AG Ratio: 1.6 (calc) (ref 1.0–2.5)
ALT: 21 U/L (ref 9–46)
AST: 24 U/L (ref 10–35)
Albumin: 4 g/dL (ref 3.6–5.1)
Alkaline phosphatase (APISO): 80 U/L (ref 35–144)
BUN: 21 mg/dL (ref 7–25)
CO2: 23 mmol/L (ref 20–32)
Calcium: 9.3 mg/dL (ref 8.6–10.3)
Chloride: 107 mmol/L (ref 98–110)
Creat: 0.96 mg/dL (ref 0.70–1.22)
Globulin: 2.5 g/dL (calc) (ref 1.9–3.7)
Glucose, Bld: 108 mg/dL — ABNORMAL HIGH (ref 65–99)
Potassium: 4.3 mmol/L (ref 3.5–5.3)
Sodium: 140 mmol/L (ref 135–146)
Total Bilirubin: 0.5 mg/dL (ref 0.2–1.2)
Total Protein: 6.5 g/dL (ref 6.1–8.1)
eGFR: 73 mL/min/{1.73_m2} (ref >=60)

## 2022-06-09 LAB — HEMOGLOBIN A1C
Hgb A1c MFr Bld: 6.5 % of total Hgb — ABNORMAL HIGH (ref <5.7)
Mean Plasma Glucose: 140 mg/dL
eAG (mmol/L): 7.7 mmol/L

## 2022-06-13 ENCOUNTER — Non-Acute Institutional Stay: Payer: Medicare HMO | Admitting: Internal Medicine

## 2022-06-13 ENCOUNTER — Encounter: Payer: Self-pay | Admitting: Internal Medicine

## 2022-06-13 VITALS — BP 134/82 | HR 81 | Temp 97.9°F | Resp 17 | Ht 70.0 in | Wt 201.6 lb

## 2022-06-13 DIAGNOSIS — E785 Hyperlipidemia, unspecified: Secondary | ICD-10-CM | POA: Diagnosis not present

## 2022-06-13 DIAGNOSIS — S61551S Open bite of right wrist, sequela: Secondary | ICD-10-CM

## 2022-06-13 DIAGNOSIS — G8929 Other chronic pain: Secondary | ICD-10-CM

## 2022-06-13 DIAGNOSIS — I10 Essential (primary) hypertension: Secondary | ICD-10-CM

## 2022-06-13 DIAGNOSIS — M545 Low back pain, unspecified: Secondary | ICD-10-CM | POA: Diagnosis not present

## 2022-06-13 DIAGNOSIS — I25810 Atherosclerosis of coronary artery bypass graft(s) without angina pectoris: Secondary | ICD-10-CM

## 2022-06-13 DIAGNOSIS — N1831 Chronic kidney disease, stage 3a: Secondary | ICD-10-CM

## 2022-06-13 DIAGNOSIS — K219 Gastro-esophageal reflux disease without esophagitis: Secondary | ICD-10-CM

## 2022-06-13 DIAGNOSIS — L509 Urticaria, unspecified: Secondary | ICD-10-CM

## 2022-06-13 DIAGNOSIS — W5501XS Bitten by cat, sequela: Secondary | ICD-10-CM

## 2022-06-13 DIAGNOSIS — R7303 Prediabetes: Secondary | ICD-10-CM

## 2022-06-13 MED ORDER — METHOCARBAMOL 500 MG PO TABS: 250.0000 mg | ORAL_TABLET | Freq: Two times a day (BID) | ORAL | 0 refills | Status: DC | PRN

## 2022-06-13 NOTE — Patient Instructions (Signed)
Can take Muscle relaxant as needed for back pain Make sure you don't drive till you know how you feel after taking it You can also use OTC Voltaren Gel BID for Low back pain  Prilosec 20 mg OTC as needed for Reflux

## 2022-06-13 NOTE — Progress Notes (Addendum)
Location:  [facility] Service:  Clinic (12)  Provider:   Code Status:  Goals of Care:     06/13/2022    1:58 PM  Advanced Directives  Does Patient Have a Medical Advance Directive? Yes  Type of Estate agent of [place];Living will;Out of facility DNR (pink MOST or yellow form)  Does patient want to make changes to medical advance directive? No - Patient declined  Copy of Healthcare Power of Attorney in Chart? Yes - validated most recent copy scanned in chart (See row information)     Chief Complaint  Patient presents with   Medical Management of Chronic Issues    6 month follow up and labs    HPI: Patient is a 88 y.o. male seen today for medical management of chronic diseases.    Lives in [place]  No Family Close by  Has h/o Hypertension, hyperlipidemia, lower extremity edema, CAD s/p CABG, anxiety with insomnia, allergic rhinitis, Chronic Urticaria   Admitted in the Hospital for IV antibiotics after Cat bite 10/14-10/17 Recovered now  Did have c/o Low back pain and wanted to get Muscle relaxant He said he has had pain like this before and he thinks is because of the hospital bed .  No history of any fall.  Pain is mostly in the lower back not going down his leg. He also stated he is having some reflux-like symptoms.  He does take Pepcid every day .  Patient is very independent otherwise Still drives   Past Medical History:  Diagnosis Date   Anxiety    AV block, 1st degree 08/06/07   Back pain    Cellulitis of lower extremity 01/11/2015   Coccygodynia 01/31/10   Coronary artery disease    Diverticulosis 1980   Dupuytren contracture 08/06/07   4th finger left hand   Essential hypertension, malignant    Gastric ulcer 12/30/01   Hearing loss 01/16/05   Hyperhidrosis, focal, primary 08/06/07   Hyperlipemia    Insomnia    Internal thrombosed hemorrhoids 04/30/01   Keratosis, actinic 08/03/09   Slowing of urinary  stream 08/04/07   Vitamin D deficiency 05/27/09    Past Surgical History:  Procedure Laterality Date   CATARACT EXTRACTION W/ INTRAOCULAR LENS IMPLANT Right 1990   Dr. Emmit Pomfret   CATARACT EXTRACTION W/ INTRAOCULAR LENS IMPLANT Left 1992   Dr. Emmit Pomfret   COLONOSCOPY  2003   Diverticulosis   CORONARY ARTERY BYPASS GRAFT  1998   Dr Andrey Campanile at [facility]   RECTAL POLYPECTOMY  1971   Dr. Elesa Hacker    Allergies  Allergen Reactions   Furosemide Itching   Orange Juice [Orange Oil] Other (See Comments)    indegestion    Outpatient Encounter Medications as of 06/13/2022  Medication Sig   aspirin 81 MG tablet Take 81 mg by mouth daily.   cetirizine (ZYRTEC) 10 MG tablet TAKE 1 TABLET DAILY (Patient taking differently: Take 10 mg by mouth daily.)   Cholecalciferol (VITAMIN D3) 50 MCG (2000 UT) TABS Take 1 tablet by mouth daily.   famotidine (PEPCID) 20 MG tablet TAKE 1 TABLET DAILY (Patient taking differently: Take 20 mg by mouth daily.)   metoprolol tartrate (LOPRESSOR) 50 MG tablet TAKE 1 TABLET TWICE A DAY (Patient taking differently: Take 50 mg by mouth 2 (two) times daily.)   Propylene Glycol (SYSTANE BALANCE) 0.6 % SOLN Apply 1 drop to eye as needed. Both eyes (Patient taking differently: Apply 1  drop to eye as needed. Both eyes dry eyes)   triamcinolone cream (KENALOG) 0.1 % APPLY 1 APPLICATION TOPICALLY AS NEEDED (Patient taking differently: Apply 1 application  topically as needed (itching).)   rosuvastatin (CRESTOR) 10 MG tablet TAKE 1 TABLET DAILY TO LOWER CHOLESTEROL (Patient not taking: Reported on 06/13/2022)   [DISCONTINUED] potassium chloride (K-DUR) 10 MEQ tablet Take 1 tablet (10 mEq total) by mouth daily as needed (take with torsemide).   No facility-administered encounter medications on file as of 06/13/2022.    Review of Systems:  Review of Systems  Constitutional:  Negative for activity change, appetite change and unexpected weight change.  HENT: Negative.    Respiratory:  Negative  for cough and shortness of breath.   Cardiovascular:  Negative for leg swelling.  Gastrointestinal:  Negative for constipation.  Genitourinary:  Negative for frequency.  Musculoskeletal:  Positive for back pain. Negative for arthralgias, gait problem and myalgias.  Skin:  Positive for rash.  Neurological:  Negative for dizziness and weakness.  Psychiatric/Behavioral:  Negative for confusion and sleep disturbance.   All other systems reviewed and are negative.   Health Maintenance  Topic Date Due   Medicare Annual Wellness (AWV)  Never done   Zoster Vaccines- Shingrix (1 of 2) Never done   INFLUENZA VACCINE  01/23/2022   COVID-19 Vaccine (3 - 2023-24 season) 02/23/2022   DTaP/Tdap/Td (5 - Td or Tdap) 04/08/2032   Pneumonia Vaccine 52+ Years old  Completed   HPV VACCINES  Aged Out    Physical Exam: Vitals:   06/13/22 1359  BP: 134/82  Pulse: 81  Resp: 17  Temp: 97.9 F (36.6 C)  TempSrc: Temporal  SpO2: 92%  Weight: 201 lb 9.6 oz (91.4 kg)  Height: 5\' 10"  (1.778 m)   Body mass index is 28.93 kg/m. Physical Exam Vitals reviewed.  Constitutional:      Appearance: Normal appearance.  HENT:     Head: Normocephalic.     Nose: Nose normal.     Mouth/Throat:     Mouth: Mucous membranes are moist.     Pharynx: Oropharynx is clear.  Eyes:     Pupils: Pupils are equal, round, and reactive to light.  Cardiovascular:     Rate and Rhythm: Normal rate and regular rhythm.     Pulses: Normal pulses.     Heart sounds: No murmur heard. Pulmonary:     Effort: Pulmonary effort is normal. No respiratory distress.     Breath sounds: Normal breath sounds. No rales.  Abdominal:     General: Abdomen is flat. Bowel sounds are normal.     Palpations: Abdomen is soft.  Musculoskeletal:        General: Swelling present.     Cervical back: Neck supple.  Skin:    General: Skin is warm.     Comments: Papular rash in his back  Neurological:     General: No focal deficit present.      Mental Status: He is alert and oriented to person, place, and time.  Psychiatric:        Mood and Affect: Mood normal.        Thought Content: Thought content normal.     Labs reviewed: Basic Metabolic Panel: Recent Labs    11/30/21 0810 04/07/22 1132 04/08/22 0447 06/07/22 0749  NA 138 135 140 140  K 4.3 3.9 3.8 4.3  CL 105 103 110 107  CO2 25 23 24 23   GLUCOSE 105* 110* 96 108*  BUN 17 12 15 21   CREATININE 1.03 1.03 0.92 0.96  CALCIUM 9.1 9.3 8.4* 9.3  TSH 3.50  --   --   --    Liver Function Tests: Recent Labs    04/07/22 1132 04/08/22 0447 06/07/22 0749  AST 19 23 24   ALT 19 21 21   ALKPHOS 59 58  --   BILITOT 0.7 0.7 0.5  PROT 7.1 6.1* 6.5  ALBUMIN 3.8 3.0*  --    Recent Labs    04/07/22 1132  LIPASE 14   No results for input(s): "AMMONIA" in the last 8760 hours. CBC: Recent Labs    11/30/21 0810 04/07/22 1132 04/08/22 0447 06/07/22 0750  WBC 7.9 13.0* 9.2 7.7  NEUTROABS 4,353 9.1*  --  4,104  HGB 15.0 14.3 13.2 14.3  HCT 45.0 42.7 41.1 41.5  MCV 95.1 97.0 99.0 93.7  PLT 192 159 165 194   Lipid Panel: Recent Labs    11/30/21 0810 06/07/22 0747  CHOL 177 232*  HDL 50 44  LDLCALC 101* 154*  TRIG 165* 196*  CHOLHDL 3.5 5.3*   Lab Results  Component Value Date   HGBA1C 6.5 (H) 06/07/2022    Procedures since last visit: No results found.  Assessment/Plan 1. Chronic midline low back pain without sciatica Wants to try Robaxin for few weeks  2. Cat bite of right wrist, sequela Finished antibiotics Healed  3. Essential hypertension Metoprolol  4. Hyperlipidemia, unspecified hyperlipidemia type Stopped statin due to Itching Does have h/o CAD and wants to restart it 3/week   5. Prediabetes A1C good  No Meds  6. Coronary artery disease involving coronary bypass graft of native heart without angina pectoris On Aspirin Beta Blocker and statin Asymptomatic    7. Stage 3a chronic kidney disease (HCC) Stable  8.  Urticaria Triamcinolone PRN  9. Gastroesophageal reflux disease without esophagitis Has some breakthrough symptoms with pepcid Can take Prilosec 20 mg OTC PRn 10 LE edema  Takes Torsemide once a month as needed   Labs/tests ordered:  * No order type specified * Next appt:  Visit date not found

## 2022-07-04 ENCOUNTER — Other Ambulatory Visit: Payer: Self-pay | Admitting: Internal Medicine

## 2022-07-11 ENCOUNTER — Other Ambulatory Visit: Payer: Self-pay | Admitting: Internal Medicine

## 2022-07-11 DIAGNOSIS — E7849 Other hyperlipidemia: Secondary | ICD-10-CM

## 2022-07-23 ENCOUNTER — Other Ambulatory Visit: Payer: Self-pay | Admitting: Internal Medicine

## 2022-10-02 ENCOUNTER — Other Ambulatory Visit: Payer: Self-pay | Admitting: Internal Medicine

## 2022-10-02 NOTE — Telephone Encounter (Signed)
Patient medication has allergy contraindications and discontinued by another Dr. Medication pend and sent to PCP Mahlon Gammon, MD for approval.

## 2022-12-19 ENCOUNTER — Encounter: Payer: Self-pay | Admitting: Internal Medicine

## 2022-12-19 ENCOUNTER — Non-Acute Institutional Stay: Payer: Medicare HMO | Admitting: Internal Medicine

## 2022-12-19 VITALS — BP 142/86 | HR 83 | Temp 97.6°F | Resp 17 | Ht 70.0 in | Wt 204.4 lb

## 2022-12-19 DIAGNOSIS — L509 Urticaria, unspecified: Secondary | ICD-10-CM

## 2022-12-19 DIAGNOSIS — M545 Low back pain, unspecified: Secondary | ICD-10-CM

## 2022-12-19 DIAGNOSIS — K219 Gastro-esophageal reflux disease without esophagitis: Secondary | ICD-10-CM

## 2022-12-19 DIAGNOSIS — G8929 Other chronic pain: Secondary | ICD-10-CM

## 2022-12-19 DIAGNOSIS — I10 Essential (primary) hypertension: Secondary | ICD-10-CM

## 2022-12-19 DIAGNOSIS — I25810 Atherosclerosis of coronary artery bypass graft(s) without angina pectoris: Secondary | ICD-10-CM

## 2022-12-19 DIAGNOSIS — E785 Hyperlipidemia, unspecified: Secondary | ICD-10-CM

## 2022-12-19 DIAGNOSIS — N1831 Chronic kidney disease, stage 3a: Secondary | ICD-10-CM

## 2022-12-19 DIAGNOSIS — R7303 Prediabetes: Secondary | ICD-10-CM

## 2022-12-19 NOTE — Progress Notes (Signed)
[facility]:  Clinic (12)  Provider:   Code Status: DNR Goals of Care:     12/19/2022   10:48 AM  Advanced Directives  Does Patient Have a Medical Advance Directive? Yes  Type of Estate agent of [place];Out of facility DNR (pink MOST or yellow form)  Does patient want to make changes to medical advance directive? No - Patient declined  Copy of Healthcare Power of Attorney in Chart? Yes - validated most recent copy scanned in chart (See row information)     Chief Complaint  Patient presents with   Medical Management of Chronic Issues    6 month follow up    HPI: Patient is a 89 y.o. male seen today for medical management of chronic diseases.   Lives in [facility]   No Family Close by   Has h/o Hypertension, hyperlipidemia, lower extremity edema, CAD s/p CABG, anxiety with insomnia, allergic rhinitis, Chronic Urticaria   Continue to do well Tylenol helping for his back pain Drives long distance   Past Medical History:  Diagnosis Date   Anxiety    AV block, 1st degree 08/06/07   Back pain    Cellulitis of lower extremity 01/11/2015   Coccygodynia 01/31/10   Coronary artery disease    Diverticulosis 1980   Dupuytren contracture 08/06/07   4th finger left hand   Essential hypertension, malignant    Gastric ulcer 12/30/01   Hearing loss 01/16/05   Hyperhidrosis, focal, primary 08/06/07   Hyperlipemia    Insomnia    Internal thrombosed hemorrhoids 04/30/01   Keratosis, actinic 08/03/09   Slowing of urinary stream 08/04/07   Vitamin D deficiency 05/27/09    Past Surgical History:  Procedure Laterality Date   CATARACT EXTRACTION W/ INTRAOCULAR LENS IMPLANT Right 1990   Dr. Emmit Pomfret   CATARACT EXTRACTION W/ INTRAOCULAR LENS IMPLANT Left 1992   Dr. Emmit Pomfret   COLONOSCOPY  2003   Diverticulosis   CORONARY ARTERY BYPASS GRAFT  1998   [facility]   RECTAL POLYPECTOMY  1971   Dr. Elesa Hacker    Allergies   Allergen Reactions   Furosemide Itching   Orange Juice [Orange Oil] Other (See Comments)    indegestion   Augmentin [Amoxicillin-Pot Clavulanate] Nausea Only    Outpatient Encounter Medications as of 12/19/2022  Medication Sig   potassium chloride (KLOR-CON M) 10 MEQ tablet TAKE 1 TABLET DAILY AS NEEDED (TAKE WITH TORSEMIDE)   aspirin 81 MG tablet Take 81 mg by mouth daily.   cetirizine (ZYRTEC) 10 MG tablet TAKE 1 TABLET DAILY   Cholecalciferol (VITAMIN D3) 50 MCG (2000 UT) TABS Take 1 tablet by mouth daily.   famotidine (PEPCID) 20 MG tablet TAKE 1 TABLET DAILY   methocarbamol (ROBAXIN) 500 MG tablet Take 0.5 tablets (250 mg total) by mouth 2 (two) times daily as needed for muscle spasms.   metoprolol tartrate (LOPRESSOR) 50 MG tablet TAKE 1 TABLET TWICE A DAY   omeprazole (PRILOSEC) 20 MG capsule Take 20 mg by mouth daily as needed.   Propylene Glycol (SYSTANE BALANCE) 0.6 % SOLN Apply 1 drop to eye as needed. Both eyes (Patient taking differently: Apply 1 drop to eye as needed. Both eyes dry eyes)   rosuvastatin (CRESTOR) 10 MG tablet TAKE 1 TABLET DAILY TO LOWER CHOLESTEROL   torsemide (DEMADEX) 20 MG tablet TAKE ONE-HALF TABLET (10 MG) AS NEEDED   triamcinolone cream (KENALOG) 0.1 % APPLY  1 APPLICATION TOPICALLY AS NEEDED (Patient taking differently: Apply 1 application  topically as needed (itching).)   [DISCONTINUED] potassium chloride (K-DUR) 10 MEQ tablet Take 1 tablet (10 mEq total) by mouth daily as needed (take with torsemide).   No facility-administered encounter medications on file as of 12/19/2022.    Review of Systems:  Review of Systems  Constitutional:  Negative for activity change, appetite change and unexpected weight change.  HENT: Negative.    Respiratory:  Negative for cough and shortness of breath.   Cardiovascular:  Negative for leg swelling.  Gastrointestinal:  Negative for constipation.  Genitourinary:  Negative for frequency.  Musculoskeletal:  Negative  for arthralgias, gait problem and myalgias.  Skin: Negative.  Negative for rash.  Neurological:  Negative for dizziness and weakness.  Psychiatric/Behavioral:  Negative for confusion and sleep disturbance.   All other systems reviewed and are negative.   Health Maintenance  Topic Date Due   Medicare Annual Wellness (AWV)  Never done   Zoster Vaccines- Shingrix (1 of 2) Never done   COVID-19 Vaccine (3 - 2023-24 season) 02/23/2022   INFLUENZA VACCINE  01/24/2023   DTaP/Tdap/Td (5 - Td or Tdap) 04/08/2032   Pneumonia Vaccine 60+ Years old  Completed   HPV VACCINES  Aged Out    Physical Exam: Vitals:   12/19/22 1045  BP: (!) 142/86  Pulse: 83  Resp: 17  Temp: 97.6 F (36.4 C)  TempSrc: Temporal  SpO2: 96%  Weight: 204 lb 6.4 oz (92.7 kg)  Height: 5\' 10"  (1.778 m)   Body mass index is 29.33 kg/m. Physical Exam Vitals reviewed.  Constitutional:      Appearance: Normal appearance.  HENT:     Head: Normocephalic.     Right Ear: Tympanic membrane normal.     Left Ear: Tympanic membrane normal.     Nose: Nose normal.     Mouth/Throat:     Mouth: Mucous membranes are moist.     Pharynx: Oropharynx is clear.  Eyes:     Pupils: Pupils are equal, round, and reactive to light.  Cardiovascular:     Rate and Rhythm: Normal rate and regular rhythm.     Pulses: Normal pulses.     Heart sounds: No murmur heard. Pulmonary:     Effort: Pulmonary effort is normal. No respiratory distress.     Breath sounds: Normal breath sounds. No rales.  Abdominal:     General: Abdomen is flat. Bowel sounds are normal.     Palpations: Abdomen is soft.  Musculoskeletal:        General: Swelling present.     Cervical back: Neck supple.  Skin:    General: Skin is warm.  Neurological:     General: No focal deficit present.     Mental Status: He is alert and oriented to person, place, and time.  Psychiatric:        Mood and Affect: Mood normal.        Thought Content: Thought content normal.     Labs reviewed: Basic Metabolic Panel: Recent Labs    04/07/22 1132 04/08/22 0447 06/07/22 0749  NA 135 140 140  K 3.9 3.8 4.3  CL 103 110 107  CO2 23 24 23   GLUCOSE 110* 96 108*  BUN 12 15 21   CREATININE 1.03 0.92 0.96  CALCIUM 9.3 8.4* 9.3   Liver Function Tests: Recent Labs    04/07/22 1132 04/08/22 0447 06/07/22 0749  AST 19 23 24   ALT 19 21  21  ALKPHOS 59 58  --   BILITOT 0.7 0.7 0.5  PROT 7.1 6.1* 6.5  ALBUMIN 3.8 3.0*  --    Recent Labs    04/07/22 1132  LIPASE 14   No results for input(s): "AMMONIA" in the last 8760 hours. CBC: Recent Labs    04/07/22 1132 04/08/22 0447 06/07/22 0750  WBC 13.0* 9.2 7.7  NEUTROABS 9.1*  --  4,104  HGB 14.3 13.2 14.3  HCT 42.7 41.1 41.5  MCV 97.0 99.0 93.7  PLT 159 165 194   Lipid Panel: Recent Labs    06/07/22 0747  CHOL 232*  HDL 44  LDLCALC 154*  TRIG 196*  CHOLHDL 5.3*   Lab Results  Component Value Date   HGBA1C 6.5 (H) 06/07/2022    Procedures since last visit: No results found.  Assessment/Plan 1. Essential hypertension Lopressor - TSH - CBC with Differential/Platelet  2. Hyperlipidemia, unspecified hyperlipidemia type Not taking statin as he gets itching with it  - Lipid panel  3. Chronic midline low back pain without sciatica Tylenol 500 mg BID PRN   4. Coronary artery disease involving coronary bypass graft of native heart without angina pectoris Betablocker, and Aspirin Not taking statin due to itching  - TSH - Lipid panel - COMPLETE METABOLIC PANEL WITH GFR  5. Stage 3a chronic kidney disease (HCC) Creat stable Avoid NSAIDS  6. Gastroesophageal reflux disease without esophagitis Takes Pepcid  7. Urticaria Not having issues recently since been off Statin 8 Goals of care He is DNR discussed again His POA is his Nephew who he lives in [place]   Labs/tests ordered:  * No order type specified * Next appt:  Visit date not found

## 2023-01-03 ENCOUNTER — Other Ambulatory Visit: Payer: Medicare HMO

## 2023-01-04 LAB — CBC WITH DIFFERENTIAL/PLATELET
Absolute Monocytes: 826 cells/uL (ref 200–950)
Basophils Absolute: 69 cells/uL (ref 0–200)
Basophils Relative: 0.8 %
Eosinophils Absolute: 318 cells/uL (ref 15–500)
Eosinophils Relative: 3.7 %
HCT: 43.3 % (ref 38.5–50.0)
Hemoglobin: 14.6 g/dL (ref 13.2–17.1)
Lymphs Abs: 2924 cells/uL (ref 850–3900)
MCH: 32.6 pg (ref 27.0–33.0)
MCHC: 33.7 g/dL (ref 32.0–36.0)
MCV: 96.7 fL (ref 80.0–100.0)
MPV: 11.9 fL (ref 7.5–12.5)
Monocytes Relative: 9.6 %
Neutro Abs: 4463 cells/uL (ref 1500–7800)
Neutrophils Relative %: 51.9 %
Platelets: 211 10*3/uL (ref 140–400)
RBC: 4.48 10*6/uL (ref 4.20–5.80)
RDW: 12.2 % (ref 11.0–15.0)
Total Lymphocyte: 34 %
WBC: 8.6 10*3/uL (ref 3.8–10.8)

## 2023-01-04 LAB — LIPID PANEL
Cholesterol: 248 mg/dL — ABNORMAL HIGH (ref <200)
HDL: 41 mg/dL (ref >=40)
LDL Cholesterol (Calc): 159 mg/dL (calc) — ABNORMAL HIGH
Non-HDL Cholesterol (Calc): 207 mg/dL (calc) — ABNORMAL HIGH (ref <130)
Total CHOL/HDL Ratio: 6 (calc) — ABNORMAL HIGH (ref <5.0)
Triglycerides: 311 mg/dL — ABNORMAL HIGH (ref <150)

## 2023-01-04 LAB — COMPLETE METABOLIC PANEL WITH GFR
AG Ratio: 1.5 (calc) (ref 1.0–2.5)
ALT: 21 U/L (ref 9–46)
AST: 22 U/L (ref 10–35)
Albumin: 4 g/dL (ref 3.6–5.1)
Alkaline phosphatase (APISO): 75 U/L (ref 35–144)
BUN: 17 mg/dL (ref 7–25)
CO2: 24 mmol/L (ref 20–32)
Calcium: 9.3 mg/dL (ref 8.6–10.3)
Chloride: 105 mmol/L (ref 98–110)
Creat: 1 mg/dL (ref 0.70–1.22)
Globulin: 2.6 g/dL (calc) (ref 1.9–3.7)
Glucose, Bld: 95 mg/dL (ref 65–99)
Potassium: 4.3 mmol/L (ref 3.5–5.3)
Sodium: 139 mmol/L (ref 135–146)
Total Bilirubin: 0.5 mg/dL (ref 0.2–1.2)
Total Protein: 6.6 g/dL (ref 6.1–8.1)
eGFR: 69 mL/min/{1.73_m2} (ref >=60)

## 2023-01-04 LAB — TSH: TSH: 3.33 mIU/L (ref 0.40–4.50)

## 2023-01-09 ENCOUNTER — Non-Acute Institutional Stay: Payer: Medicare HMO | Admitting: Internal Medicine

## 2023-01-09 ENCOUNTER — Encounter: Payer: Self-pay | Admitting: Internal Medicine

## 2023-01-09 VITALS — BP 130/84 | HR 84 | Temp 97.9°F | Resp 17 | Ht 70.0 in | Wt 207.0 lb

## 2023-01-09 DIAGNOSIS — E785 Hyperlipidemia, unspecified: Secondary | ICD-10-CM

## 2023-01-09 DIAGNOSIS — I10 Essential (primary) hypertension: Secondary | ICD-10-CM

## 2023-01-09 DIAGNOSIS — I25810 Atherosclerosis of coronary artery bypass graft(s) without angina pectoris: Secondary | ICD-10-CM

## 2023-01-09 MED ORDER — SIMVASTATIN 40 MG PO TABS: 40.0000 mg | ORAL_TABLET | Freq: Every day | ORAL | 3 refills | Status: DC

## 2023-01-09 NOTE — Progress Notes (Signed)
Location[facility]  Clinic (12)  Provider:   Code Status:  Goals of Care:     01/09/2023   10:36 AM  Advanced Directives  Does Patient Have a Medical Advance Directive? Yes  Type of Estate agent of [place];Out of facility DNR (pink MOST or yellow form)  Does patient want to make changes to medical advance directive? No - Patient declined  Copy of Healthcare Power of Attorney in Chart? Yes - validated most recent copy scanned in chart (See row information)     Chief Complaint  Patient presents with   Acute Visit    Patient state he is here to discuss labs    Immunizations    Patient declined the immunizations due   Quality Metric Gaps    Patient declined AWV    HPI: Patient is a 89 y.o. male seen today for an acute visit for Follow up after his labs  Lives in [facility]   No Family Close by   Has h/o Hypertension, hyperlipidemia, lower extremity edema, CAD s/p CABG, anxiety with insomnia, allergic rhinitis, Chronic Urticaria   Came to discuss his Lipid panel He was on Crestor he stopped it as causing itching Lipitor caused Myalgias Wants to try something else for his Cholesterol He is still very active and has h/o CAD and wants to continue Statin    Past Medical History:  Diagnosis Date   Anxiety    AV block, 1st degree 08/06/07   Back pain    Cellulitis of lower extremity 01/11/2015   Coccygodynia 01/31/10   Coronary artery disease    Diverticulosis 1980   Dupuytren contracture 08/06/07   4th finger left hand   Essential hypertension, malignant    Gastric ulcer 12/30/01   Hearing loss 01/16/05   Hyperhidrosis, focal, primary 08/06/07   Hyperlipemia    Insomnia    Internal thrombosed hemorrhoids 04/30/01   Keratosis, actinic 08/03/09   Slowing of urinary stream 08/04/07   Vitamin D deficiency 05/27/09    Past Surgical History:  Procedure Laterality Date   CATARACT EXTRACTION W/ INTRAOCULAR LENS IMPLANT  Right 1990   Dr. Emmit Pomfret   CATARACT EXTRACTION W/ INTRAOCULAR LENS IMPLANT Left 1992   Dr. Emmit Pomfret   COLONOSCOPY  2003   Diverticulosis   CORONARY ARTERY BYPASS GRAFT  1998   [facility]   RECTAL POLYPECTOMY  1971   Dr. Elesa Hacker    Allergies  Allergen Reactions   Furosemide Itching   Orange Juice [Orange Oil] Other (See Comments)    indegestion   Augmentin [Amoxicillin-Pot Clavulanate] Nausea Only    Outpatient Encounter Medications as of 01/09/2023  Medication Sig   aspirin 81 MG tablet Take 81 mg by mouth daily.   cetirizine (ZYRTEC) 10 MG tablet TAKE 1 TABLET DAILY   Cholecalciferol (VITAMIN D3) 50 MCG (2000 UT) TABS Take 1 tablet by mouth daily.   famotidine (PEPCID) 20 MG tablet TAKE 1 TABLET DAILY   metoprolol tartrate (LOPRESSOR) 50 MG tablet TAKE 1 TABLET TWICE A DAY   potassium chloride (KLOR-CON M) 10 MEQ tablet TAKE 1 TABLET DAILY AS NEEDED (TAKE WITH TORSEMIDE)   Propylene Glycol (SYSTANE BALANCE) 0.6 % SOLN Apply to eye 2 (two) times daily as needed.   simvastatin (ZOCOR) 40 MG tablet Take 1 tablet (40 mg total) by mouth at bedtime.   torsemide (DEMADEX) 20 MG tablet TAKE ONE-HALF TABLET (10 MG) AS NEEDED   triamcinolone  cream (KENALOG) 0.1 % Apply 1 Application topically 2 (two) times daily as needed.   [DISCONTINUED] potassium chloride (K-DUR) 10 MEQ tablet Take 1 tablet (10 mEq total) by mouth daily as needed (take with torsemide).   No facility-administered encounter medications on file as of 01/09/2023.    Review of Systems:  Review of Systems  Constitutional:  Negative for activity change, appetite change and unexpected weight change.  HENT: Negative.    Respiratory:  Negative for cough and shortness of breath.   Cardiovascular:  Positive for leg swelling.  Gastrointestinal:  Negative for constipation.  Genitourinary:  Negative for frequency.  Musculoskeletal:  Negative for arthralgias, gait problem and myalgias.  Skin: Negative.  Negative for rash.   Neurological:  Negative for dizziness and weakness.  Psychiatric/Behavioral:  Negative for confusion and sleep disturbance.   All other systems reviewed and are negative.   Health Maintenance  Topic Date Due   Medicare Annual Wellness (AWV)  Never done   COVID-19 Vaccine (3 - 2023-24 season) 01/25/2023 (Originally 02/23/2022)   Zoster Vaccines- Shingrix (1 of 2) 04/11/2023 (Originally 10/17/1975)   INFLUENZA VACCINE  01/24/2023   DTaP/Tdap/Td (5 - Td or Tdap) 04/08/2032   Pneumonia Vaccine 51+ Years old  Completed   HPV VACCINES  Aged Out    Physical Exam: Vitals:   01/09/23 1031  BP: 130/84  Pulse: 84  Resp: 17  Temp: 97.9 F (36.6 C)  TempSrc: Temporal  SpO2: 92%  Weight: 207 lb (93.9 kg)  Height: 5\' 10"  (1.778 m)   Body mass index is 29.7 kg/m. Physical Exam Vitals reviewed.  Constitutional:      Appearance: Normal appearance.  HENT:     Head: Normocephalic.     Nose: Nose normal.     Mouth/Throat:     Mouth: Mucous membranes are moist.     Pharynx: Oropharynx is clear.  Eyes:     Pupils: Pupils are equal, round, and reactive to light.  Cardiovascular:     Rate and Rhythm: Normal rate and regular rhythm.     Pulses: Normal pulses.     Heart sounds: No murmur heard. Pulmonary:     Effort: Pulmonary effort is normal. No respiratory distress.     Breath sounds: Normal breath sounds. No rales.  Abdominal:     General: Abdomen is flat. Bowel sounds are normal.     Palpations: Abdomen is soft.  Musculoskeletal:        General: No swelling.     Cervical back: Neck supple.  Skin:    General: Skin is warm.  Neurological:     General: No focal deficit present.     Mental Status: He is alert and oriented to person, place, and time.  Psychiatric:        Mood and Affect: Mood normal.        Thought Content: Thought content normal.     Labs reviewed: Basic Metabolic Panel: Recent Labs    04/08/22 0447 06/07/22 0749 01/03/23 0810  NA 140 140 139  K 3.8  4.3 4.3  CL 110 107 105  CO2 24 23 24   GLUCOSE 96 108* 95  BUN 15 21 17   CREATININE 0.92 0.96 1.00  CALCIUM 8.4* 9.3 9.3  TSH  --   --  3.33   Liver Function Tests: Recent Labs    04/07/22 1132 04/08/22 0447 06/07/22 0749 01/03/23 0810  AST 19 23 24 22   ALT 19 21 21 21   ALKPHOS 59 58  --   --  BILITOT 0.7 0.7 0.5 0.5  PROT 7.1 6.1* 6.5 6.6  ALBUMIN 3.8 3.0*  --   --    Recent Labs    04/07/22 1132  LIPASE 14   No results for input(s): "AMMONIA" in the last 8760 hours. CBC: Recent Labs    04/07/22 1132 04/08/22 0447 06/07/22 0750 01/03/23 0810  WBC 13.0* 9.2 7.7 8.6  NEUTROABS 9.1*  --  4,104 4,463  HGB 14.3 13.2 14.3 14.6  HCT 42.7 41.1 41.5 43.3  MCV 97.0 99.0 93.7 96.7  PLT 159 165 194 211   Lipid Panel: Recent Labs    06/07/22 0747 01/03/23 0810  CHOL 232* 248*  HDL 44 41  LDLCALC 154* 159*  TRIG 196* 311*  CHOLHDL 5.3* 6.0*   Lab Results  Component Value Date   HGBA1C 6.5 (H) 06/07/2022    Procedures since last visit: No results found.  Assessment/Plan 1. Hyperlipidemia, unspecified hyperlipidemia type He had Itching with Crestor and Muscle pain with Lipitor He wants to try something else Will Try simvastatin If itching start  he will stop it and will let me know Will check labs in 3 months with Follow up lipid Patient is very active and wants to stay on Statin 2. Essential hypertension Lopressor  3. Coronary artery disease involving coronary bypass graft of native heart without angina pectoris Aspirin Adding Statin again    Labs/tests ordered:  * No order type specified * Next appt:  Visit date not found

## 2023-04-04 ENCOUNTER — Other Ambulatory Visit: Payer: Medicare HMO

## 2023-04-05 LAB — LIPID PANEL
Cholesterol: 162 mg/dL (ref <200)
HDL: 45 mg/dL (ref >=40)
LDL Cholesterol (Calc): 87 mg/dL
Non-HDL Cholesterol (Calc): 117 mg/dL (ref <130)
Total CHOL/HDL Ratio: 3.6 (calc) (ref <5.0)
Triglycerides: 210 mg/dL — ABNORMAL HIGH (ref <150)

## 2023-04-10 ENCOUNTER — Encounter: Payer: Self-pay | Admitting: Internal Medicine

## 2023-04-10 ENCOUNTER — Non-Acute Institutional Stay: Payer: Medicare HMO | Admitting: Internal Medicine

## 2023-04-10 VITALS — BP 136/72 | HR 96 | Temp 97.7°F | Resp 17 | Ht 70.0 in | Wt 200.7 lb

## 2023-04-10 DIAGNOSIS — I10 Essential (primary) hypertension: Secondary | ICD-10-CM

## 2023-04-10 DIAGNOSIS — I25810 Atherosclerosis of coronary artery bypass graft(s) without angina pectoris: Secondary | ICD-10-CM

## 2023-04-10 DIAGNOSIS — G8929 Other chronic pain: Secondary | ICD-10-CM

## 2023-04-10 DIAGNOSIS — E785 Hyperlipidemia, unspecified: Secondary | ICD-10-CM

## 2023-04-10 DIAGNOSIS — M545 Low back pain, unspecified: Secondary | ICD-10-CM

## 2023-04-10 MED ORDER — ROSUVASTATIN CALCIUM 10 MG PO TABS: 10.0000 mg | ORAL_TABLET | Freq: Every day | ORAL | 3 refills | Status: AC

## 2023-04-10 NOTE — Progress Notes (Signed)
[facility]  Cl[facility] (12)  Provider:   Code Status: DNR Goals of Care:     04/10/2023   10:19 AM  Advanced Directives  Does Patient Have a Medical Advance Directive? No;Yes  Does patient want to make changes to medical advance directive? No - Guardian declined     Chief Complaint  Patient presents with   Medical Management of Chronic Issues    Patient is being see for 3 month follow up     HPI: Patient is a 89 y.o. male seen today for an acute visit for Follow up of his new statin Lives in [facility]   No Family Close by   Has h/o Hypertension, hyperlipidemia, lower extremity edema, CAD s/p CABG, anxiety with insomnia, allergic rhinitis, Chronic Urticaria   He was on Crestor he stopped it as causing itching Lipitor caused Myalgias Wanted  to try something else for his Cholesterol He is still very active and has h/o CAD and wants to continue Statin   I started him on Zocor He is saying it caused him to have Constipation And now he wants to go back to Crestor    Past Medical History:  Diagnosis Date   Anxiety    AV block, 1st degree 08/06/07   Back pain    Cellulitis of lower extremity 01/11/2015   Coccygodynia 01/31/10   Coronary artery disease    Diverticulosis 1980   Dupuytren contracture 08/06/07   4th finger left hand   Essential hypertension, malignant    Gastric ulcer 12/30/01   Hearing loss 01/16/05   Hyperhidrosis, focal, primary 08/06/07   Hyperlipemia    Insomnia    Internal thrombosed hemorrhoids 04/30/01   Keratosis, actinic 08/03/09   Slowing of urinary stream 08/04/07   Vitamin D deficiency 05/27/09    Past Surgical History:  Procedure Laterality Date   CATARACT EXTRACTION W/ INTRAOCULAR LENS IMPLANT Right 1990   Dr. Emmit Pomfret   CATARACT EXTRACTION W/ INTRAOCULAR LENS IMPLANT Left 1992   Dr. Emmit Pomfret   COLONOSCOPY  2003   Diverticulosis   CORONARY ARTERY BYPASS GRAFT  1998   [facility]   RECTAL  POLYPECTOMY  1971   Dr. Elesa Hacker    Allergies  Allergen Reactions   Furosemide Itching   Orange Juice [Orange Oil] Other (See Comments)    indegestion   Augmentin [Amoxicillin-Pot Clavulanate] Nausea Only    Outpatient Encounter Medications as of 04/10/2023  Medication Sig   aspirin 81 MG tablet Take 81 mg by mouth daily.   cetirizine (ZYRTEC) 10 MG tablet TAKE 1 TABLET DAILY   Cholecalciferol (VITAMIN D3) 50 MCG (2000 UT) TABS Take 1 tablet by mouth daily.   famotidine (PEPCID) 20 MG tablet TAKE 1 TABLET DAILY   metoprolol tartrate (LOPRESSOR) 50 MG tablet TAKE 1 TABLET TWICE A DAY   potassium chloride (KLOR-CON M) 10 MEQ tablet TAKE 1 TABLET DAILY AS NEEDED (TAKE WITH TORSEMIDE)   Propylene Glycol (SYSTANE BALANCE) 0.6 % SOLN Apply to eye 2 (two) times daily as needed.   rosuvastatin (CRESTOR) 10 MG tablet Take 1 tablet (10 mg total) by mouth daily.   torsemide (DEMADEX) 20 MG tablet TAKE ONE-HALF TABLET (10 MG) AS NEEDED   triamcinolone cream (KENALOG) 0.1 % Apply 1 Application topically 2 (two) times daily as needed.   [DISCONTINUED] potassium chloride (K-DUR) 10 MEQ tablet Take 1 tablet (10 mEq total) by mouth daily as needed (take with torsemide).   [  DISCONTINUED] simvastatin (ZOCOR) 40 MG tablet Take 1 tablet (40 mg total) by mouth at bedtime.   No facility-administered encounter medications on file as of 04/10/2023.    Review of Systems:  Review of Systems  Constitutional:  Negative for activity change, appetite change and unexpected weight change.  HENT: Negative.    Respiratory:  Negative for cough and shortness of breath.   Cardiovascular:  Negative for leg swelling.  Gastrointestinal:  Positive for constipation.  Genitourinary:  Negative for frequency.  Musculoskeletal:  Negative for arthralgias, gait problem and myalgias.  Skin: Negative.  Negative for rash.  Neurological:  Negative for dizziness and weakness.  Psychiatric/Behavioral:  Negative for confusion and  sleep disturbance.   All other systems reviewed and are negative.   Health Maintenance  Topic Date Due   Medicare Annual Wellness (AWV)  Never done   INFLUENZA VACCINE  01/24/2023   COVID-19 Vaccine (3 - 2023-24 season) 02/24/2023   Zoster Vaccines- Shingrix (1 of 2) 04/11/2023 (Originally 10/17/1975)   DTaP/Tdap/Td (5 - Td or Tdap) 04/08/2032   Pneumonia Vaccine 49+ Years old  Completed   HPV VACCINES  Aged Out    Physical Exam: Vitals:   04/10/23 1017  BP: 136/72  Pulse: 96  Resp: 17  Temp: 97.7 F (36.5 C)  TempSrc: Temporal  SpO2: 90%  Weight: 200 lb 11.2 oz (91 kg)  Height: 5\' 10"  (1.778 m)   Body mass index is 28.8 kg/m. Physical Exam Vitals reviewed.  Constitutional:      Appearance: Normal appearance.  HENT:     Head: Normocephalic.     Nose: Nose normal.     Mouth/Throat:     Mouth: Mucous membranes are moist.     Pharynx: Oropharynx is clear.  Eyes:     Pupils: Pupils are equal, round, and reactive to light.  Cardiovascular:     Rate and Rhythm: Normal rate and regular rhythm.     Pulses: Normal pulses.     Heart sounds: No murmur heard. Pulmonary:     Effort: Pulmonary effort is normal. No respiratory distress.     Breath sounds: Normal breath sounds. No rales.  Abdominal:     General: Abdomen is flat. Bowel sounds are normal.     Palpations: Abdomen is soft.  Musculoskeletal:        General: Swelling present.     Cervical back: Neck supple.  Skin:    General: Skin is warm.  Neurological:     General: No focal deficit present.     Mental Status: He is alert and oriented to person, place, and time.  Psychiatric:        Mood and Affect: Mood normal.        Thought Content: Thought content normal.     Labs reviewed: Basic Metabolic Panel: Recent Labs    06/07/22 0749 01/03/23 0810  NA 140 139  K 4.3 4.3  CL 107 105  CO2 23 24  GLUCOSE 108* 95  BUN 21 17  CREATININE 0.96 1.00  CALCIUM 9.3 9.3  TSH  --  3.33   Liver Function  Tests: Recent Labs    06/07/22 0749 01/03/23 0810  AST 24 22  ALT 21 21  BILITOT 0.5 0.5  PROT 6.5 6.6   No results for input(s): "LIPASE", "AMYLASE" in the last 8760 hours. No results for input(s): "AMMONIA" in the last 8760 hours. CBC: Recent Labs    06/07/22 0750 01/03/23 0810  WBC 7.7 8.6  NEUTROABS  4,104 4,463  HGB 14.3 14.6  HCT 41.5 43.3  MCV 93.7 96.7  PLT 194 211   Lipid Panel: Recent Labs    06/07/22 0747 01/03/23 0810 04/04/23 0800  CHOL 232* 248* 162  HDL 44 41 45  LDLCALC 154* 159* 87  TRIG 196* 311* 210*  CHOLHDL 5.3* 6.0* 3.6   Lab Results  Component Value Date   HGBA1C 6.5 (H) 06/07/2022    Procedures since last visit: No results found.  Assessment/Plan 1. Hyperlipidemia, unspecified hyperlipidemia type Changes it back to Crestor  2. Essential hypertension Lopressor  3. Coronary artery disease involving coronary bypass graft of native heart without angina pectoris Aspirin and statin  4. Chronic midline low back pain without sciatica Tylenol 5 Chronic Leg edema Uses Torsemide PRN    Labs/tests ordered:  * No order type specified * Next appt:  Visit date not found

## 2023-06-28 ENCOUNTER — Other Ambulatory Visit: Payer: Self-pay | Admitting: Internal Medicine

## 2023-07-08 ENCOUNTER — Other Ambulatory Visit: Payer: Self-pay | Admitting: Internal Medicine

## 2023-07-18 ENCOUNTER — Other Ambulatory Visit: Payer: Self-pay | Admitting: Internal Medicine

## 2023-07-18 NOTE — Telephone Encounter (Signed)
High Risk Warning Populated when attempting to refill, I will send to Provider for further review 

## 2023-09-26 ENCOUNTER — Other Ambulatory Visit: Payer: Self-pay | Admitting: Internal Medicine

## 2023-09-26 NOTE — Telephone Encounter (Signed)
 Pharmacy requested refill.  Pended Rx and sent to Dr. Chales Abrahams for approval due to HIGH ALERT Warning.

## 2023-10-03 ENCOUNTER — Other Ambulatory Visit: Payer: Medicare Other

## 2023-10-09 ENCOUNTER — Encounter: Payer: Self-pay | Admitting: Internal Medicine

## 2023-10-09 ENCOUNTER — Non-Acute Institutional Stay: Payer: Medicare Other | Admitting: Internal Medicine

## 2023-10-09 VITALS — BP 138/88 | HR 91 | Temp 97.3°F | Resp 17 | Ht 70.0 in | Wt 212.5 lb

## 2023-10-09 DIAGNOSIS — E785 Hyperlipidemia, unspecified: Secondary | ICD-10-CM | POA: Diagnosis not present

## 2023-10-09 DIAGNOSIS — R600 Localized edema: Secondary | ICD-10-CM

## 2023-10-09 DIAGNOSIS — K219 Gastro-esophageal reflux disease without esophagitis: Secondary | ICD-10-CM

## 2023-10-09 DIAGNOSIS — I25810 Atherosclerosis of coronary artery bypass graft(s) without angina pectoris: Secondary | ICD-10-CM

## 2023-10-09 DIAGNOSIS — I10 Essential (primary) hypertension: Secondary | ICD-10-CM

## 2023-10-09 DIAGNOSIS — L509 Urticaria, unspecified: Secondary | ICD-10-CM

## 2023-10-09 NOTE — Patient Instructions (Addendum)
 Please come to [facility] clinic Monday 02/10/2024 at 7:45am for labs.

## 2023-10-09 NOTE — Progress Notes (Signed)
 [facility]  [facility] Service:  Clinic (12)  Provider:   Code Status: DNR Goals of Care:     10/09/2023   10:27 AM  Advanced Directives  Does Patient Have a Medical Advance Directive? Yes  Type of Estate agent of [place];Living will;Out of facility DNR (pink MOST or yellow form)  Does patient want to make changes to medical advance directive? No - Patient declined  Copy of Healthcare Power of Attorney in Chart? Yes - validated most recent copy scanned in chart (See row information)     Chief Complaint  Patient presents with   Medical Management of Chronic Issues    6 month follow up. Discuss the need for Shingrix vaccine, Covid Booster, and AWV.     HPI: Patient is a 89 y.o. male seen today for medical management of chronic diseases.   Lives in [facility] [place]   No Family Close by   Has h/o Hypertension, hyperlipidemia, lower extremity edema, CAD s/p CABG, anxiety with insomnia, allergic rhinitis, Chronic Urticaria  He is on Crestor which gives him constipation but manages with Dulcolax Prn Chronic Itching sees Derm in [place] on Steroid cream  Still drives No Falls Very active does not use any cane or walker Planning to go to [place] with his friend Has gained weight Does not exercise or walk Wt Readings from Last 3 Encounters:  10/09/23 212 lb 8 oz (96.4 kg)  04/10/23 200 lb 11.2 oz (91 kg)  01/09/23 207 lb (93.9 kg)     Past Medical History:  Diagnosis Date   Anxiety    AV block, 1st degree 08/06/07   Back pain    Cellulitis of lower extremity 01/11/2015   Coccygodynia 01/31/10   Coronary artery disease    Diverticulosis 1980   Dupuytren contracture 08/06/07   4th finger left hand   Essential hypertension, malignant    Gastric ulcer 12/30/01   Hearing loss 01/16/05   Hyperhidrosis, focal, primary 08/06/07   Hyperlipemia    Insomnia    Internal thrombosed hemorrhoids 04/30/01   Keratosis, actinic 08/03/09    Slowing of urinary stream 08/04/07   Vitamin D deficiency 05/27/09    Past Surgical History:  Procedure Laterality Date   CATARACT EXTRACTION W/ INTRAOCULAR LENS IMPLANT Right 1990   Dr. Emmit Pomfret   CATARACT EXTRACTION W/ INTRAOCULAR LENS IMPLANT Left 1992   Dr. Emmit Pomfret   COLONOSCOPY  2003   Diverticulosis   CORONARY ARTERY BYPASS GRAFT  1998   [facility]   RECTAL POLYPECTOMY  1971   Dr. Elesa Hacker    Allergies  Allergen Reactions   Furosemide Itching   Orange Juice [Orange Oil] Other (See Comments)    indegestion   Augmentin [Amoxicillin-Pot Clavulanate] Nausea Only    Outpatient Encounter Medications as of 10/09/2023  Medication Sig   aspirin 81 MG tablet Take 81 mg by mouth daily.   cetirizine (ZYRTEC) 10 MG tablet TAKE 1 TABLET DAILY   Cholecalciferol (VITAMIN D3) 50 MCG (2000 UT) TABS Take 1 tablet by mouth daily.   famotidine (PEPCID) 20 MG tablet TAKE 1 TABLET DAILY   metoprolol tartrate (LOPRESSOR) 50 MG tablet TAKE 1 TABLET TWICE A DAY   potassium chloride (KLOR-CON M) 10 MEQ tablet TAKE 1 TABLET DAILY AS NEEDED (TAKE WITH TORSEMIDE)   Propylene Glycol (SYSTANE BALANCE) 0.6 % SOLN Apply to eye 2 (two) times daily as needed.   rosuvastatin (CRESTOR) 10 MG tablet Take 1  tablet (10 mg total) by mouth daily. (Patient taking differently: Take 10 mg by mouth 3 (three) times a week.)   torsemide (DEMADEX) 20 MG tablet TAKE ONE-HALF TABLET (10 MG) AS NEEDED   triamcinolone cream (KENALOG) 0.1 % Apply 1 Application topically 2 (two) times daily as needed.   [DISCONTINUED] simvastatin (ZOCOR) 40 MG tablet Take 40 mg by mouth at bedtime.   [DISCONTINUED] potassium chloride (K-DUR) 10 MEQ tablet Take 1 tablet (10 mEq total) by mouth daily as needed (take with torsemide).   No facility-administered encounter medications on file as of 10/09/2023.    Review of Systems:  Review of Systems  Constitutional:  Negative for activity change, appetite change and unexpected weight change.   HENT: Negative.    Respiratory:  Negative for cough and shortness of breath.   Cardiovascular:  Positive for leg swelling.  Gastrointestinal:  Negative for constipation.  Genitourinary:  Negative for frequency.  Musculoskeletal:  Negative for arthralgias, gait problem and myalgias.  Skin: Negative.  Negative for rash.  Neurological:  Negative for dizziness and weakness.  Psychiatric/Behavioral:  Negative for confusion and sleep disturbance.   All other systems reviewed and are negative.   Health Maintenance  Topic Date Due   Medicare Annual Wellness (AWV)  Never done   Zoster Vaccines- Shingrix (1 of 2) 10/17/1977   COVID-19 Vaccine (7 - 2024-25 season) 09/24/2023   INFLUENZA VACCINE  01/24/2024   DTaP/Tdap/Td (5 - Td or Tdap) 04/08/2032   Pneumonia Vaccine 53+ Years old  Completed   HPV VACCINES  Aged Out   Meningococcal B Vaccine  Aged Out    Physical Exam: Vitals:   10/09/23 1017  BP: 138/88  Pulse: 91  Resp: 17  Temp: (!) 97.3 F (36.3 C)  SpO2: 91%  Weight: 212 lb 8 oz (96.4 kg)  Height: 5\' 10"  (1.778 m)   Body mass index is 30.49 kg/m. Physical Exam Vitals reviewed.  Constitutional:      Appearance: Normal appearance.  HENT:     Head: Normocephalic.     Nose: Nose normal.     Mouth/Throat:     Mouth: Mucous membranes are moist.     Pharynx: Oropharynx is clear.  Eyes:     Pupils: Pupils are equal, round, and reactive to light.  Cardiovascular:     Rate and Rhythm: Normal rate and regular rhythm.     Pulses: Normal pulses.     Heart sounds: No murmur heard. Pulmonary:     Effort: Pulmonary effort is normal. No respiratory distress.     Breath sounds: Normal breath sounds. No rales.  Abdominal:     General: Abdomen is flat. Bowel sounds are normal.     Palpations: Abdomen is soft.  Musculoskeletal:        General: Swelling present.     Cervical back: Neck supple.  Skin:    General: Skin is warm.  Neurological:     General: No focal deficit  present.     Mental Status: He is alert and oriented to person, place, and time.  Psychiatric:        Mood and Affect: Mood normal.        Thought Content: Thought content normal.     Labs reviewed: Basic Metabolic Panel: Recent Labs    01/03/23 0810  NA 139  K 4.3  CL 105  CO2 24  GLUCOSE 95  BUN 17  CREATININE 1.00  CALCIUM 9.3  TSH 3.33   Liver Function Tests:  Recent Labs    01/03/23 0810  AST 22  ALT 21  BILITOT 0.5  PROT 6.6   No results for input(s): "LIPASE", "AMYLASE" in the last 8760 hours. No results for input(s): "AMMONIA" in the last 8760 hours. CBC: Recent Labs    01/03/23 0810  WBC 8.6  NEUTROABS 4,463  HGB 14.6  HCT 43.3  MCV 96.7  PLT 211   Lipid Panel: Recent Labs    01/03/23 0810 04/04/23 0800  CHOL 248* 162  HDL 41 45  LDLCALC 159* 87  TRIG 311* 210*  CHOLHDL 6.0* 3.6   Lab Results  Component Value Date   HGBA1C 6.5 (H) 06/07/2022    Procedures since last visit: No results found.  Assessment/Plan 1. Hyperlipidemia, unspecified hyperlipidemia type (Primary) Crestor 3/week  2. Essential hypertension Lopressor  3. Coronary artery disease involving coronary bypass graft of native heart without angina pectoris Aspirin and Statin  4. Gastroesophageal reflux disease without esophagitis Pepcid  5. Urticaria Triamcinolone Follows with Derm  6. Bilateral leg edema Torsemide PRN    Labs/tests ordered:  * No order type specified * Next appt:  Visit date not found

## 2023-11-11 ENCOUNTER — Ambulatory Visit: Payer: Self-pay

## 2023-11-11 NOTE — Telephone Encounter (Signed)
 Copied from CRM [id number]. Topic: Clinical - Red Word Triage >> Nov 11, 2023  4:09 PM Bearl Botts A wrote: Red Word that prompted transfer to Nurse Triage: Patient right ankle is swollen and painful.   Chief Complaint: Ankle swelling  Symptoms: Right ankle swelling and pain  Frequency: Constant  Pertinent Negatives: Patient denies fever or shortness of breath  Disposition: [] ED /[] Urgent Care (no appt availability in office) / [x] Appointment(In office/virtual)/ []  [facility]/ [] Home Care/ [] Refused Recommended Disposition /[] [facility]/ []  Follow-up with PCP Additional Notes: Patient reports he has had right ankle swelling and pain for the last 4 days. He states his ankle and foot look puffy and states that he has pain with walking and with palpation. He denies any shortness of breath or fever. He states he has been taking Aleve with some relief but states it doesn't fully resolve his pain. Appointment made in the office tomorrow per the patient's request.     Reason for Disposition  MILD or MODERATE ankle swelling (e.g., can't move joint normally, can't do usual activities) (Exceptions: Itchy, localized swelling; swelling is chronic.)  Answer Assessment - Initial Assessment Questions 1. LOCATION: "Which ankle is swollen?" "Where is the swelling?"     Right ankle  2. ONSET: "When did the swelling start?"     4 days  3. SWELLING: "How bad is the swelling?" Or, "How large is it?" (e.g., mild, moderate, severe; size of localized swelling)    - NONE: No joint swelling.   - LOCALIZED: Localized; small area of puffy or swollen skin (e.g., insect bite, skin irritation).   - MILD: Joint looks or feels mildly swollen or puffy.   - MODERATE: Swollen; interferes with normal activities (e.g., work or school); decreased range of movement; may be limping.   - SEVERE: Very swollen; can't move swollen joint at all; limping a lot or unable to walk.     Mild to moderate   4. PAIN: "Is  there any pain?" If Yes, ask: "How bad is it?" (Scale 1-10; or mild, moderate, severe)   - NONE (0): no pain.   - MILD (1-3): doesn't interfere with normal activities.    - MODERATE (4-7): interferes with normal activities (e.g., work or school) or awakens from sleep, limping.    - SEVERE (8-10): excruciating pain, unable to do any normal activities, unable to walk.      Moderate  5. CAUSE: "What do you think caused the ankle swelling?"     Unsure  6. OTHER SYMPTOMS: "Do you have any other symptoms?" (e.g., fever, chest pain, difficulty breathing, calf pain)     No  Protocols used: Ankle Swelling-A-AH

## 2023-11-11 NOTE — Telephone Encounter (Signed)
 See Triage Note from Triage Nurse.  Patient has an appointment to seen by Tye Gall, MD . Marguerite Shiley, MD has been notified   Message sent to Marguerite Shiley, MD and Tye Gall, MD

## 2023-11-12 ENCOUNTER — Ambulatory Visit (INDEPENDENT_AMBULATORY_CARE_PROVIDER_SITE_OTHER): Admitting: Sports Medicine

## 2023-11-12 ENCOUNTER — Encounter: Payer: Self-pay | Admitting: Sports Medicine

## 2023-11-12 ENCOUNTER — Ambulatory Visit
Admission: RE | Admit: 2023-11-12 | Discharge: 2023-11-12 | Disposition: A | Discharge status: Home / Self Care | Source: Ambulatory Visit | Attending: Sports Medicine | Admitting: Sports Medicine

## 2023-11-12 ENCOUNTER — Other Ambulatory Visit: Payer: Self-pay | Admitting: Internal Medicine

## 2023-11-12 VITALS — BP 126/68 | HR 91 | Temp 96.7°F | Resp 18 | Ht 70.0 in | Wt 208.6 lb

## 2023-11-12 DIAGNOSIS — M7989 Other specified soft tissue disorders: Secondary | ICD-10-CM

## 2023-11-12 DIAGNOSIS — M25571 Pain in right ankle and joints of right foot: Secondary | ICD-10-CM

## 2023-11-12 DIAGNOSIS — I10 Essential (primary) hypertension: Secondary | ICD-10-CM | POA: Diagnosis not present

## 2023-11-12 MED ORDER — ACETAMINOPHEN ER 650 MG PO TBCR: 650.0000 mg | EXTENDED_RELEASE_TABLET | Freq: Three times a day (TID) | ORAL | 0 refills | Status: DC | PRN

## 2023-11-12 MED ORDER — ACETAMINOPHEN ER 650 MG PO TBCR: 650.0000 mg | EXTENDED_RELEASE_TABLET | Freq: Three times a day (TID) | ORAL | 0 refills | Status: AC | PRN

## 2023-11-12 NOTE — Telephone Encounter (Signed)
 Copied from CRM [id number]. Topic: Clinical - Prescription Issue >> Nov 12, 2023 12:02 PM Brynn Caras wrote: Reason for CRM: The patient states his prescription has been sent to the mail delievery pharmacy - [facility] which would take at least 3 days to arrive. He is requesting for acetaminophen  (TYLENOL  8 HOUR) 650 MG CR tablet sent to [facility]  [address], [place], [place] [address]  Phone #[phone] Fax#: [phone]

## 2023-11-12 NOTE — Telephone Encounter (Signed)
 Gelene Kelly, RN to [facility] (Selected Message)    11/12/23  2:19 PM Wants sent to different pharmacy

## 2023-11-12 NOTE — Progress Notes (Signed)
 Careteam: Patient Care Team: Marguerite Shiley, MD as PCP - General (Internal Medicine) Chantal Comment, MD (Dermatology) Ngetich, Elijio Guadeloupe, NP as Nurse Practitioner (Family Medicine)  PLACE OF SERVICE:  [facility]  Advanced Directive information Does Patient Have a Medical Advance Directive?: Yes, Type of Advance Directive: Healthcare Power of [place];Living will;Out of facility DNR (pink MOST or yellow form)  Allergies  Allergen Reactions   Furosemide  Itching   Orange Juice [Orange Oil] Other (See Comments)    indegestion   Augmentin  [Amoxicillin -Pot Clavulanate] Nausea Only    Chief Complaint  Patient presents with   Ankle Pain    Right ankle swelling and pain x4 days, no fever. See Triage Notes from Triage Nurse     Discussed the use of AI scribe software for clinical note transcription with the patient, who gave verbal consent to proceed.  History of Present Illness   Samuel Clayton is a 89 year old male who presents with right ankle pain and swelling.  He has been experiencing pain and swelling in his right ankle for the past five to six days. The pain is described as burning and is particularly severe when weight is applied, rated as seven or eight out of ten during ambulation, and three or four when at rest. There is no history of recent trauma or twisting of the ankle, but he mentions wearing shoes without arch support, which he believes may have contributed to the issue. He has since discarded these shoes.  He has taken Aleve, four tablets per day for four days, which provided some pain relief but did not resolve the issue. He has not taken any Aleve today. He denies any history of gout.  His current medications include torsemide , which he takes occasionally as needed for swelling in both legs. No chest pain, breathing difficulties, or pain during urination   No recent falls, chest pain, stomach pain, or urinary symptoms. He reports no issues with mood, stating he is  happy and has been blessed with good health.  Review of Systems:  Review of Systems  Constitutional:  Negative for chills and fever.  HENT:  Negative for congestion and sore throat.   Eyes:  Negative for double vision.  Respiratory:  Negative for cough, sputum production and shortness of breath.   Cardiovascular:  Positive for leg swelling. Negative for chest pain and palpitations.  Gastrointestinal:  Negative for abdominal pain, heartburn and nausea.  Genitourinary:  Negative for dysuria, frequency and hematuria.  Musculoskeletal:  Positive for joint pain. Negative for falls and myalgias.  Neurological:  Negative for dizziness, sensory change and focal weakness.   Negative unless indicated in HPI.   Past Medical History:  Diagnosis Date   Anxiety    AV block, 1st degree 08/06/07   Back pain    Cellulitis of lower extremity 01/11/2015   Coccygodynia 01/31/10   Coronary artery disease    Diverticulosis 1980   Dupuytren contracture 08/06/07   4th finger left hand   Essential hypertension, malignant    Gastric ulcer 12/30/01   Hearing loss 01/16/05   Hyperhidrosis, focal, primary 08/06/07   Hyperlipemia    Insomnia    Internal thrombosed hemorrhoids 04/30/01   Keratosis, actinic 08/03/09   Slowing of urinary stream 08/04/07   Vitamin D  deficiency 05/27/09   Past Surgical History:  Procedure Laterality Date   CATARACT EXTRACTION W/ INTRAOCULAR LENS IMPLANT Right 1990   Dr. Neil Balls   CATARACT EXTRACTION W/ INTRAOCULAR LENS IMPLANT Left  1992   Dr. Neil Balls   COLONOSCOPY  2003   Diverticulosis   CORONARY ARTERY BYPASS GRAFT  1998   [facility]   RECTAL POLYPECTOMY  1971   Dr. Ralston Burkes   Social History:   reports that he quit smoking about 46 years ago. His smoking use included cigars. He has never used smokeless tobacco. He reports current alcohol  use. He reports that he does not use drugs.  Family History  Problem Relation Age of Onset   Heart disease Mother    Heart disease Father     Parkinson's disease Father    Diabetes Sister    Kidney disease Sister    Heart disease Sister    Lung disease Brother    Diabetes Sister    Cerebrovascular Accident Sister     Medications: Patient's Medications  New Prescriptions   No medications on file  Previous Medications   ASPIRIN  81 MG TABLET    Take 81 mg by mouth daily.   CETIRIZINE  (ZYRTEC ) 10 MG TABLET    TAKE 1 TABLET DAILY   CHOLECALCIFEROL  (VITAMIN D3) 50 MCG (2000 UT) TABS    Take 1 tablet by mouth daily.   FAMOTIDINE  (PEPCID ) 20 MG TABLET    TAKE 1 TABLET DAILY   METOPROLOL  TARTRATE (LOPRESSOR ) 50 MG TABLET    TAKE 1 TABLET TWICE A DAY   POTASSIUM CHLORIDE  (KLOR-CON  M) 10 MEQ TABLET    TAKE 1 TABLET DAILY AS NEEDED (TAKE WITH TORSEMIDE )   PROPYLENE GLYCOL (SYSTANE BALANCE) 0.6 % SOLN    Apply to eye 2 (two) times daily as needed.   ROSUVASTATIN  (CRESTOR ) 10 MG TABLET    Take 1 tablet (10 mg total) by mouth daily.   TORSEMIDE  (DEMADEX ) 20 MG TABLET    TAKE ONE-HALF TABLET (10 MG) AS NEEDED   TRIAMCINOLONE  CREAM (KENALOG ) 0.1 %    Apply 1 Application topically 2 (two) times daily as needed.  Modified Medications   No medications on file  Discontinued Medications   No medications on file    Physical Exam: Vitals:   11/12/23 0805  BP: 126/68  Pulse: 91  Resp: 18  Temp: (!) 96.7 F (35.9 C)  SpO2: 96%  Weight: 208 lb 9.6 oz (94.6 kg)  Height: 5\' 10"  (1.778 m)   Body mass index is 29.93 kg/m. BP Readings from Last 3 Encounters:  11/12/23 126/68  10/09/23 138/88  04/10/23 136/72   Wt Readings from Last 3 Encounters:  11/12/23 208 lb 9.6 oz (94.6 kg)  10/09/23 212 lb 8 oz (96.4 kg)  04/10/23 200 lb 11.2 oz (91 kg)    Physical Exam Constitutional:      Appearance: Normal appearance.  HENT:     Head: Normocephalic and atraumatic.  Cardiovascular:     Rate and Rhythm: Normal rate and regular rhythm.     Pulses: Normal pulses.     Heart sounds: Normal heart sounds.  Pulmonary:     Effort: No  respiratory distress.     Breath sounds: No stridor. No wheezing or rales.  Abdominal:     General: Bowel sounds are normal. There is no distension.     Palpations: Abdomen is soft.     Tenderness: There is no abdominal tenderness. There is no right CVA tenderness or guarding.  Musculoskeletal:        General: Swelling present.     Comments: Rt ankle- no redness, no warmth  Tenderness on RT medial malleolus Rom intact  Neurological:     Mental Status: He is alert. Mental status is at baseline.     Sensory: No sensory deficit.     Motor: No weakness.     Labs reviewed: Basic Metabolic Panel: Recent Labs    01/03/23 0810  NA 139  K 4.3  CL 105  CO2 24  GLUCOSE 95  BUN 17  CREATININE 1.00  CALCIUM  9.3  TSH 3.33   Liver Function Tests: Recent Labs    01/03/23 0810  AST 22  ALT 21  BILITOT 0.5  PROT 6.6   No results for input(s): "LIPASE", "AMYLASE" in the last 8760 hours. No results for input(s): "AMMONIA" in the last 8760 hours. CBC: Recent Labs    01/03/23 0810  WBC 8.6  NEUTROABS 4,463  HGB 14.6  HCT 43.3  MCV 96.7  PLT 211   Lipid Panel: Recent Labs    01/03/23 0810 04/04/23 0800  CHOL 248* 162  HDL 41 45  LDLCALC 159* 87  TRIG 311* 210*  CHOLHDL 6.0* 3.6   TSH: Recent Labs    01/03/23 0810  TSH 3.33   A1C: Lab Results  Component Value Date   HGBA1C 6.5 (H) 06/07/2022    Assessment and Plan Assessment & Plan   1. Acute right ankle pain (Primary) Rt ankle tenderness at medial malleolus Will get x ray  Will order labs esr, crp - Sedimentation Rate - C-reactive Protein - DG Ankle Complete Right; Future - acetaminophen  (TYLENOL  8 HOUR) 650 MG CR tablet; Take 1 tablet (650 mg total) by mouth every 8 (eight) hours as needed for pain.  Dispense: 30 tablet; Refill: 0  2. Primary hypertension At goal  Cont with metoprolol   3. Swelling of lower extremity Chronic Instructed patient to elevate feet  Use compression  stockings Avoid salt intake

## 2023-11-13 ENCOUNTER — Ambulatory Visit: Payer: Self-pay | Admitting: Sports Medicine

## 2023-11-13 LAB — C-REACTIVE PROTEIN: CRP: <3 mg/L (ref <8.0)

## 2023-11-13 LAB — SEDIMENTATION RATE: Sed Rate: 9 mm/h (ref 0–20)

## 2023-12-06 ENCOUNTER — Ambulatory Visit: Admitting: Podiatry

## 2023-12-06 ENCOUNTER — Encounter: Payer: Self-pay | Admitting: Podiatry

## 2023-12-06 DIAGNOSIS — B351 Tinea unguium: Secondary | ICD-10-CM

## 2023-12-06 DIAGNOSIS — M79675 Pain in left toe(s): Secondary | ICD-10-CM | POA: Diagnosis not present

## 2023-12-06 DIAGNOSIS — M79674 Pain in right toe(s): Secondary | ICD-10-CM

## 2023-12-06 NOTE — Progress Notes (Signed)
 This patient presents to the office with chief complaint of long thick painful nails.  Patient says the nails are painful walking and wearing shoes.  This patient is unable to self treat.  This patient is unable to trim his  nails since he is unable to reach his  nails.  She presents to the office for preventative foot care services.  General Appearance  Alert, conversant and in no acute stress.  Vascular  Dorsalis pedis and posterior tibial  pulses are  weakly palpable  bilaterally.  Capillary return is within normal limits  bilaterally. Temperature is within normal limits  bilaterally.  Neurologic  Senn-Weinstein monofilament wire test within normal limits  bilaterally. Muscle power within normal limits bilaterally.  Nails Thick disfigured discolored nails with subungual debris  from hallux to fifth toes bilaterally. No evidence of bacterial infection or drainage bilaterally. Pincer nails  B/L.  Orthopedic  No limitations of motion  feet .  No crepitus or effusions noted.  No bony pathology or digital deformities noted.  Skin  normotropic skin with no porokeratosis noted bilaterally.  No signs of infections or ulcers noted.     Onychomycosis  Nails  B/L.  Pain in right toes  Pain in left toes  Debridement of nails both feet followed trimming the nails with dremel tool.    RTC prn   Ruffin Cotton DPM

## 2023-12-12 ENCOUNTER — Other Ambulatory Visit: Payer: Self-pay | Admitting: Internal Medicine

## 2023-12-12 NOTE — Telephone Encounter (Signed)
 Patient is requesting refill on medication that's not on list. Medication pend and sent to PCP Marguerite Shiley, MD for approval.

## 2024-01-02 ENCOUNTER — Other Ambulatory Visit: Payer: Self-pay | Admitting: Internal Medicine

## 2024-02-10 LAB — CBC WITH DIFFERENTIAL/PLATELET
Absolute Lymphocytes: 3159 {cells}/uL (ref 850–3900)
Absolute Monocytes: 784 {cells}/uL (ref 200–950)
Basophils Absolute: 74 {cells}/uL (ref 0–200)
Basophils Relative: 0.7 %
Eosinophils Absolute: 509 {cells}/uL — ABNORMAL HIGH (ref 15–500)
Eosinophils Relative: 4.8 %
HCT: 43.8 % (ref 38.5–50.0)
Hemoglobin: 14.4 g/dL (ref 13.2–17.1)
MCH: 31.6 pg (ref 27.0–33.0)
MCHC: 32.9 g/dL (ref 32.0–36.0)
MCV: 96.3 fL (ref 80.0–100.0)
MPV: 11.9 fL (ref 7.5–12.5)
Monocytes Relative: 7.4 %
Neutro Abs: 6074 {cells}/uL (ref 1500–7800)
Neutrophils Relative %: 57.3 %
Platelets: 172 Thousand/uL (ref 140–400)
RBC: 4.55 Million/uL (ref 4.20–5.80)
RDW: 11.9 % (ref 11.0–15.0)
Total Lymphocyte: 29.8 %
WBC: 10.6 Thousand/uL (ref 3.8–10.8)

## 2024-02-10 LAB — COMPLETE METABOLIC PANEL WITHOUT GFR
AG Ratio: 1.6 (calc) (ref 1.0–2.5)
ALT: 14 U/L (ref 9–46)
AST: 17 U/L (ref 10–35)
Albumin: 3.9 g/dL (ref 3.6–5.1)
Alkaline phosphatase (APISO): 69 U/L (ref 35–144)
BUN: 15 mg/dL (ref 7–25)
CO2: 26 mmol/L (ref 20–32)
Calcium: 9 mg/dL (ref 8.6–10.3)
Chloride: 107 mmol/L (ref 98–110)
Creat: 1 mg/dL (ref 0.70–1.22)
Globulin: 2.5 g/dL (ref 1.9–3.7)
Glucose, Bld: 98 mg/dL (ref 65–99)
Potassium: 4.2 mmol/L (ref 3.5–5.3)
Sodium: 140 mmol/L (ref 135–146)
Total Bilirubin: 0.6 mg/dL (ref 0.2–1.2)
Total Protein: 6.4 g/dL (ref 6.1–8.1)

## 2024-02-10 LAB — LIPID PANEL
Cholesterol: 172 mg/dL (ref <200)
HDL: 50 mg/dL (ref >=40)
LDL Cholesterol (Calc): 92 mg/dL
Non-HDL Cholesterol (Calc): 122 mg/dL (ref <130)
Total CHOL/HDL Ratio: 3.4 (calc) (ref <5.0)
Triglycerides: 198 mg/dL — ABNORMAL HIGH (ref <150)

## 2024-02-10 LAB — TSH: TSH: 3.79 m[IU]/L (ref 0.40–4.50)

## 2024-02-12 ENCOUNTER — Encounter: Payer: Self-pay | Admitting: Internal Medicine

## 2024-02-12 ENCOUNTER — Ambulatory Visit: Admitting: Internal Medicine

## 2024-02-12 VITALS — BP 140/92 | HR 84 | Temp 97.4°F | Ht 70.0 in | Wt 207.5 lb

## 2024-02-12 DIAGNOSIS — I10 Essential (primary) hypertension: Secondary | ICD-10-CM | POA: Diagnosis not present

## 2024-02-12 DIAGNOSIS — M7989 Other specified soft tissue disorders: Secondary | ICD-10-CM

## 2024-02-12 DIAGNOSIS — K219 Gastro-esophageal reflux disease without esophagitis: Secondary | ICD-10-CM | POA: Diagnosis not present

## 2024-02-12 DIAGNOSIS — L509 Urticaria, unspecified: Secondary | ICD-10-CM

## 2024-02-12 DIAGNOSIS — E785 Hyperlipidemia, unspecified: Secondary | ICD-10-CM

## 2024-02-12 DIAGNOSIS — I25810 Atherosclerosis of coronary artery bypass graft(s) without angina pectoris: Secondary | ICD-10-CM

## 2024-02-12 MED ORDER — OMEPRAZOLE 40 MG PO CPDR: 40.0000 mg | DELAYED_RELEASE_CAPSULE | Freq: Every day | ORAL | 3 refills | Status: DC

## 2024-02-12 NOTE — Progress Notes (Signed)
 Location:    [facility] Audiological scientist of Service:   Clinic  Provider:   Code Status: DNR Goals of Care:     11/12/2023    8:14 AM  Advanced Directives  Does Patient Have a Medical Advance Directive? Yes  Type of Estate agent of [place];Living will;Out of facility DNR (pink MOST or yellow form)  Copy of Healthcare Power of Attorney in Chart? Yes - validated most recent copy scanned in chart (See row information)     Chief Complaint  Patient presents with   Medical Management of Chronic Issues    Patient would like to switch one of his medications due to constipation. Still having indigestion even with meds. Does not want to take the simvastatin  and it's not on the med list.    HPI: Patient is a 89 y.o. male seen today for medical management of chronic diseases.   Lives in [facility] [place]   No Family Close by   Has h/o Hypertension, hyperlipidemia, lower extremity edema, CAD s/p CABG, anxiety with insomnia, allergic rhinitis, Chronic Urticaria  Chronic Itching sees Derm in [place] on Steroid cream   Still Drives Very Active Does not uses Cane or walker  Discussed the use of AI scribe software for clinical note transcription with the patient, who gave verbal consent to proceed.  History of Present Illness   Samuel Clayton is a 89 year old male who presents with concerns about medication side effects and management. Hypertension . His blood pressure was elevated this morning, likely due to incorrect dosing of metoprolol , taking two tablets at once instead of one twice daily.   He takes a diuretic only when ankle swelling occurs, which is infrequent.   GERD Frequent indigestion and gas are attributed to a Pepcid  substitute, with occasional use of over-the-counter Pepcid . He experiences chest discomfort during heartburn episodes.   A longstanding skin lesion treated with cryotherapy has slightly increased in size over twenty years. He has lost six  pounds since his last visit, likely due to dietary changes, including reduced bread and dessert intake.      Past Medical History:  Diagnosis Date   Anxiety    AV block, 1st degree 08/06/07   Back pain    Cellulitis of lower extremity 01/11/2015   Coccygodynia 01/31/10   Coronary artery disease    Diverticulosis 1980   Dupuytren contracture 08/06/07   4th finger left hand   Essential hypertension, malignant    Gastric ulcer 12/30/01   Hearing loss 01/16/05   Hyperhidrosis, focal, primary 08/06/07   Hyperlipemia    Insomnia    Internal thrombosed hemorrhoids 04/30/01   Keratosis, actinic 08/03/09   Slowing of urinary stream 08/04/07   Vitamin D  deficiency 05/27/09    Past Surgical History:  Procedure Laterality Date   CATARACT EXTRACTION W/ INTRAOCULAR LENS IMPLANT Right 1990   Dr. Carrie   CATARACT EXTRACTION W/ INTRAOCULAR LENS IMPLANT Left 1992   Dr. Carrie   COLONOSCOPY  2003   Diverticulosis   CORONARY ARTERY BYPASS GRAFT  1998   Dr Tanda at [facility]   RECTAL POLYPECTOMY  1971   Dr. Drury    Allergies  Allergen Reactions   Furosemide  Itching   Orange Juice [Orange Oil] Other (See Comments)    indegestion   Augmentin  [Amoxicillin -Pot Clavulanate] Nausea Only    Outpatient Encounter Medications as of 02/12/2024  Medication Sig   aspirin  81 MG tablet Take 81 mg by mouth daily.  cetirizine  (ZYRTEC ) 10 MG tablet TAKE 1 TABLET DAILY   Cholecalciferol  (VITAMIN D3) 50 MCG (2000 UT) TABS Take 1 tablet by mouth daily.   famotidine  (PEPCID ) 20 MG tablet TAKE 1 TABLET DAILY   metoprolol  tartrate (LOPRESSOR ) 50 MG tablet TAKE 1 TABLET TWICE A DAY   omeprazole  (PRILOSEC) 40 MG capsule Take 1 capsule (40 mg total) by mouth daily.   potassium chloride  (KLOR-CON  M) 10 MEQ tablet TAKE 1 TABLET DAILY AS NEEDED (TAKE WITH TORSEMIDE )   rosuvastatin  (CRESTOR ) 10 MG tablet Take 1 tablet (10 mg total) by mouth daily. (Patient taking differently: Take 10 mg by mouth 3 (three) times a week.)    torsemide  (DEMADEX ) 20 MG tablet TAKE ONE-HALF TABLET (10 MG) AS NEEDED   acetaminophen  (TYLENOL  8 HOUR) 650 MG CR tablet Take 1 tablet (650 mg total) by mouth every 8 (eight) hours as needed for pain.   Propylene Glycol (SYSTANE BALANCE) 0.6 % SOLN Apply to eye 2 (two) times daily as needed.   triamcinolone  cream (KENALOG ) 0.1 % Apply 1 Application topically 2 (two) times daily as needed.   [DISCONTINUED] potassium chloride  (K-DUR) 10 MEQ tablet Take 1 tablet (10 mEq total) by mouth daily as needed (take with torsemide ).   No facility-administered encounter medications on file as of 02/12/2024.    Review of Systems:  Review of Systems  Constitutional:  Negative for activity change, appetite change and unexpected weight change.  HENT: Negative.    Respiratory:  Negative for cough and shortness of breath.   Cardiovascular:  Positive for leg swelling.  Gastrointestinal:  Positive for constipation.  Genitourinary:  Negative for frequency.  Musculoskeletal:  Negative for arthralgias, gait problem and myalgias.  Skin: Negative.  Negative for rash.  Neurological:  Negative for dizziness and weakness.  Psychiatric/Behavioral:  Negative for confusion and sleep disturbance.   All other systems reviewed and are negative.   Health Maintenance  Topic Date Due   Medicare Annual Wellness (AWV)  Never done   Zoster Vaccines- Shingrix (1 of 2) 10/17/1977   COVID-19 Vaccine (7 - 2024-25 season) 06/24/2024 (Originally 09/24/2023)   INFLUENZA VACCINE  09/22/2024 (Originally 01/24/2024)   DTaP/Tdap/Td (5 - Td or Tdap) 04/08/2032   Pneumococcal Vaccine: 50+ Years  Completed   HPV VACCINES  Aged Out   Meningococcal B Vaccine  Aged Out   Pneumococcal Vaccine  Discontinued    Physical Exam: Vitals:   02/12/24 0810 02/12/24 0816  BP: (!) 140/100 (!) 140/92  Pulse: 84   Temp: (!) 97.4 F (36.3 C)   SpO2: 94%   Weight: 207 lb 8 oz (94.1 kg)   Height: 5' 10 (1.778 m)    Body mass index is 29.77  kg/m. Physical Exam Vitals reviewed.  Constitutional:      Appearance: Normal appearance.  HENT:     Head: Normocephalic.     Nose: Nose normal.     Mouth/Throat:     Mouth: Mucous membranes are moist.     Pharynx: Oropharynx is clear.  Eyes:     Pupils: Pupils are equal, round, and reactive to light.  Cardiovascular:     Rate and Rhythm: Normal rate and regular rhythm.     Pulses: Normal pulses.     Heart sounds: No murmur heard. Pulmonary:     Effort: Pulmonary effort is normal. No respiratory distress.     Breath sounds: Normal breath sounds. No rales.  Abdominal:     General: Abdomen is flat. Bowel sounds are normal.  Palpations: Abdomen is soft.  Musculoskeletal:        General: Swelling present.     Cervical back: Neck supple.  Skin:    General: Skin is warm.  Neurological:     General: No focal deficit present.     Mental Status: He is alert and oriented to person, place, and time.  Psychiatric:        Mood and Affect: Mood normal.        Thought Content: Thought content normal.     Labs reviewed: Basic Metabolic Panel: Recent Labs    02/10/24 0000  NA 140  K 4.2  CL 107  CO2 26  GLUCOSE 98  BUN 15  CREATININE 1.00  CALCIUM  9.0  TSH 3.79   Liver Function Tests: Recent Labs    02/10/24 0000  AST 17  ALT 14  BILITOT 0.6  PROT 6.4   No results for input(s): LIPASE, AMYLASE in the last 8760 hours. No results for input(s): AMMONIA in the last 8760 hours. CBC: Recent Labs    02/10/24 0000  WBC 10.6  NEUTROABS 6,074  HGB 14.4  HCT 43.8  MCV 96.3  PLT 172   Lipid Panel: Recent Labs    04/04/23 0800 02/10/24 0000  CHOL 162 172  HDL 45 50  LDLCALC 87 92  TRIG 210* 198*  CHOLHDL 3.6 3.4   Lab Results  Component Value Date   HGBA1C 6.5 (H) 06/07/2022    Procedures since last visit: No results found.  Assessment/Plan 1. Primary hypertension (Primary) He is not taking his metoprolol  BID just taking it once a  day Reminded him to take it BID  2. Swelling of lower extremity Takes Torsemide  rarely  3. Hyperlipidemia, unspecified hyperlipidemia type On Crestor  3/week  4. Gastroesophageal reflux disease without esophagitis Recent more symptoms Started on Prilosec for 4 weeks and then take it PRN  5. Coronary artery disease involving coronary bypass graft of native heart without angina pectoris Aspirin  and Statin  6. Urticaria Sees Dermatology Uses Triamcinolone  PRn  Reminded him of all the Vaccines he needs  Labs/tests ordered:  * No order type specified * Next appt:  Visit date not found

## 2024-02-12 NOTE — Patient Instructions (Addendum)
 Please visit your nearest pharmacy to receive your Covid and Flu vaccines. You also need New Pneumonia Shot PCV 20 RSV shot And Shingrix  Take your BP pill Twice a day Take your Reflux med with breakfast every day for 4 weeks and then as needed  Please have your blood work done here at [facility] on August 03, 2024 @ 7:45.

## 2024-03-24 ENCOUNTER — Other Ambulatory Visit: Payer: Self-pay | Admitting: Internal Medicine

## 2024-03-24 NOTE — Telephone Encounter (Signed)
 High Risk Warning Populated when attempting to refill, I will send to Provider for further review

## 2024-04-16 ENCOUNTER — Ambulatory Visit: Payer: Self-pay

## 2024-04-16 NOTE — Telephone Encounter (Signed)
 FYI Only or Action Required?: Action required by provider: update on patient condition and request for RX for soma .  Patient was last seen in primary care on 02/12/2024 by Charlanne Fredia CROME, MD.  Called Nurse Triage reporting Back Pain.  Symptoms began several days ago.  Interventions attempted: OTC medications: aleve.  Symptoms are: unchanged.  Triage Disposition: See PCP When Office is Open (Within 3 Days)  Patient/caregiver understands and will follow disposition?: Yes  Copied from CRM #[id number]. Topic: Clinical - Red Word Triage >> Apr 16, 2024  3:44 PM Alfonso ORN wrote: Red Word that prompted transfer to Nurse Triage:  patient call requesting muscle relaxer medication ,  back pain  rate pain 7  when sit down and with any movement causes pain   Patient is scheduled for next Wednesday to see Gupta,Anjali ,however patient do not want to wait that long to see provider for his back pain Reason for Disposition  [1] MODERATE back pain (e.g., interferes with normal activities) AND [2] present > 3 days  Answer Assessment - Initial Assessment Questions States that years ago patient took muscle relaxer, SOMA , that helped with the pain and that he requests for a muscle relaxer to to be called into his [facility] [address] [facility]   1. ONSET: When did the pain begin? (e.g., minutes, hours, days)     Two days 2. LOCATION: Where does it hurt? (upper, mid or lower back)     Mid back pain just below shoulder blades 3. SEVERITY: How bad is the pain?  (e.g., Scale 1-10; mild, moderate, or severe)     7/10 4. PATTERN: Is the pain constant? (e.g., yes, no; constant, intermittent)      Pain when patient transitions from sit to stand 5. RADIATION: Does the pain shoot into your legs or somewhere else?     denies 6. CAUSE:  What do you think is causing the back pain?      Unsure 7. BACK OVERUSE:  Any recent lifting of heavy objects, strenuous work or exercise?     denies 8.  MEDICINES: What have you taken so far for the pain? (e.g., nothing, acetaminophen , NSAIDS)     Has tried aspirin  and aleve which helps, but does not cure 9. NEUROLOGIC SYMPTOMS: Do you have any weakness, numbness, or problems with bowel/bladder control?     denies 10. OTHER SYMPTOMS: Do you have any other symptoms? (e.g., fever, abdomen pain, burning with urination, blood in urine)       denies 11. PREGNANCY: Is there any chance you are pregnant? When was your last menstrual period?       N/a  Protocols used: Back Pain-A-AH

## 2024-04-16 NOTE — Telephone Encounter (Signed)
 FYI Only or Action Required?: FYI only for provider.  Patient was last seen in primary care on 02/12/2024 by Charlanne Fredia CROME, MD.  Called Nurse Triage reporting Back Pain.  Symptoms began 2 days ago.  Interventions attempted: OTC medications: Aleve.  Symptoms are: unchanged.  Triage Disposition: See PCP When Office is Open (Within 3 Days)  Patient/caregiver understands and will follow disposition?: Yes with modifications, first available appointment scheduled.      Copied from CRM [id number]. Topic: Clinical - Red Word Triage >> Apr 16, 2024 10:08 AM Mercer PEDLAR wrote: Red Word that prompted transfer to Nurse Triage: Back pain.       Reason for Disposition  [1] Age > 50 AND [2] no history of prior similar back pain  Answer Assessment - Initial Assessment Questions First available appointment scheduled. Patient instructed to call back for new or worsening symptoms. Patient verbalized understanding and agreement with this plan.     1. ONSET: When did the pain begin? (e.g., minutes, hours, days)     2 days ago 2. LOCATION: Where does it hurt? (upper, mid or lower back)     Upper back   3. SEVERITY: How bad is the pain?  (e.g., Scale 1-10; mild, moderate, or severe)     6/10 4. PATTERN: Is the pain constant? (e.g., yes, no; constant, intermittent)      Intermittent, worse with movement  5. RADIATION: Does the pain shoot into your legs or somewhere else?     No 6. CAUSE:  What do you think is causing the back pain?      Unsure  7. BACK OVERUSE:  Any recent lifting of heavy objects, strenuous work or exercise?     No 8. MEDICINES: What have you taken so far for the pain? (e.g., nothing, acetaminophen , NSAIDS)     Aleve  9. NEUROLOGIC SYMPTOMS: Do you have any weakness, numbness, or problems with bowel/bladder control?     No 10. OTHER SYMPTOMS: Do you have any other symptoms? (e.g., fever, abdomen pain, burning with urination, blood in urine)        No  Protocols used: Back Pain-A-AH

## 2024-04-17 ENCOUNTER — Emergency Department (HOSPITAL_BASED_OUTPATIENT_CLINIC_OR_DEPARTMENT_OTHER): Admitting: Radiology

## 2024-04-17 ENCOUNTER — Emergency Department (HOSPITAL_BASED_OUTPATIENT_CLINIC_OR_DEPARTMENT_OTHER)
Admission: EM | Admit: 2024-04-17 | Discharge: 2024-04-17 | Disposition: A | Discharge status: Home / Self Care | Attending: Emergency Medicine | Admitting: Emergency Medicine

## 2024-04-17 ENCOUNTER — Other Ambulatory Visit: Payer: Self-pay

## 2024-04-17 DIAGNOSIS — I2510 Atherosclerotic heart disease of native coronary artery without angina pectoris: Secondary | ICD-10-CM | POA: Diagnosis not present

## 2024-04-17 DIAGNOSIS — X58XXXA Exposure to other specified factors, initial encounter: Secondary | ICD-10-CM | POA: Diagnosis not present

## 2024-04-17 DIAGNOSIS — S29002A Unspecified injury of muscle and tendon of back wall of thorax, initial encounter: Secondary | ICD-10-CM | POA: Diagnosis present

## 2024-04-17 DIAGNOSIS — I10 Essential (primary) hypertension: Secondary | ICD-10-CM | POA: Insufficient documentation

## 2024-04-17 DIAGNOSIS — S22060A Wedge compression fracture of T7-T8 vertebra, initial encounter for closed fracture: Secondary | ICD-10-CM

## 2024-04-17 DIAGNOSIS — Z7982 Long term (current) use of aspirin: Secondary | ICD-10-CM | POA: Diagnosis not present

## 2024-04-17 DIAGNOSIS — S22068A Other fracture of T7-T8 thoracic vertebra, initial encounter for closed fracture: Secondary | ICD-10-CM | POA: Insufficient documentation

## 2024-04-17 MED ORDER — METHOCARBAMOL 500 MG PO TABS: 500.0000 mg | ORAL_TABLET | Freq: Two times a day (BID) | ORAL | 0 refills | Status: DC

## 2024-04-17 NOTE — ED Provider Notes (Signed)
 [place] EMERGENCY DEPARTMENT AT [facility] Provider Note   CSN: [id number] Arrival date & time: 04/17/24  9185     Patient presents with: Back Pain   Samuel Clayton is a 89 y.o. male.   Patient is a very pleasant 89 year old male with a history of hypertension, hyperlipidemia, CAD status post bypass surgery 35 years ago, prior gastric ulcer who is presenting today with complaint of thoracic back pain.  He reports it started about 3 days ago and has been persistent and not improving.  He notices it most when he changes position.  He reports once he is up walking does not bother it and once he is down in the bed lying down or sitting does not bother it is just the movement in between.  It radiates out to the sides of his ribs.  He has not had any new shortness of breath, cough.  He denies any chest or abdominal pain.  His appetite has been normal and he has been able to do all of his normal ADLs.  He has no pain that radiates into his arms or his legs.  Reports his bowel movements and urinary habits are the same.  He did take 2 Aleve and reports improvement in the pain.  He called his doctor but reports they could not see him till next week and he was not sure he could wait till next Wednesday.  He denies any falls or trauma  The history is provided by the patient and medical records.  Back Pain      Prior to Admission medications   Medication Sig Start Date End Date Taking? Authorizing Provider  methocarbamol  (ROBAXIN ) 500 MG tablet Take 1 tablet (500 mg total) by mouth 2 (two) times daily. 04/17/24  Yes Tom Ragsdale, Benton, MD  acetaminophen  (TYLENOL  8 HOUR) 650 MG CR tablet Take 1 tablet (650 mg total) by mouth every 8 (eight) hours as needed for pain. 11/12/23   Charlanne Fredia CROME, MD  aspirin  81 MG tablet Take 81 mg by mouth daily.    [provider]  cetirizine  (ZYRTEC ) 10 MG tablet TAKE 1 TABLET DAILY 07/18/23   Charlanne Fredia CROME, MD  Cholecalciferol  (VITAMIN D3) 50 MCG  (2000 UT) TABS Take 1 tablet by mouth daily.    [provider]  famotidine  (PEPCID ) 20 MG tablet TAKE 1 TABLET DAILY 07/18/23   Gupta, Anjali L, MD  metoprolol  tartrate (LOPRESSOR ) 50 MG tablet TAKE 1 TABLET TWICE A DAY 01/02/24   Charlanne Fredia CROME, MD  omeprazole  (PRILOSEC) 40 MG capsule Take 1 capsule (40 mg total) by mouth daily. 02/12/24   Charlanne Fredia CROME, MD  potassium chloride  (KLOR-CON  M) 10 MEQ tablet TAKE 1 TABLET DAILY AS NEEDED (TAKE WITH TORSEMIDE ) 06/28/23   Charlanne Fredia CROME, MD  Propylene Glycol (SYSTANE BALANCE) 0.6 % SOLN Apply to eye 2 (two) times daily as needed.    [provider]  rosuvastatin  (CRESTOR ) 10 MG tablet Take 1 tablet (10 mg total) by mouth daily. Patient taking differently: Take 10 mg by mouth 3 (three) times a week. 04/10/23   Charlanne Fredia CROME, MD  torsemide  (DEMADEX ) 20 MG tablet TAKE ONE-HALF TABLET (10 MG) AS NEEDED 03/24/24   Charlanne Fredia CROME, MD  triamcinolone  cream (KENALOG ) 0.1 % Apply 1 Application topically 2 (two) times daily as needed.    [provider]  potassium chloride  (K-DUR) 10 MEQ tablet Take 1 tablet (10 mEq total) by mouth daily as needed (take with torsemide ).  12/07/18 11/09/19  Charlanne Fredia CROME, MD    Allergies: Furosemide , Orange juice monetta oil], and Augmentin  [amoxicillin -pot clavulanate]    Review of Systems  Musculoskeletal:  Positive for back pain.    Updated Vital Signs BP (!) 144/65   Pulse 66   Resp 14   SpO2 93%   Physical Exam Vitals and nursing note reviewed.  Constitutional:      General: He is not in acute distress.    Appearance: He is well-developed.  HENT:     Head: Normocephalic and atraumatic.  Eyes:     Conjunctiva/sclera: Conjunctivae normal.     Pupils: Pupils are equal, round, and reactive to light.  Cardiovascular:     Rate and Rhythm: Normal rate and regular rhythm.     Pulses: Normal pulses.     Heart sounds: No murmur heard. Pulmonary:     Effort: Pulmonary effort is normal. No  respiratory distress.     Breath sounds: Normal breath sounds. No wheezing or rales.     Comments: Fine crackles heard at the bases bilaterally Abdominal:     General: There is no distension.     Palpations: Abdomen is soft.     Tenderness: There is no abdominal tenderness. There is no right CVA tenderness, left CVA tenderness, guarding or rebound.  Musculoskeletal:        General: No tenderness. Normal range of motion.     Cervical back: Normal range of motion and neck supple.       Back:  Skin:    General: Skin is warm and dry.     Findings: No erythema or rash.  Neurological:     Mental Status: He is alert and oriented to person, place, and time. Mental status is at baseline.     Sensory: No sensory deficit.     Motor: No weakness.     Gait: Gait normal.     Comments: Negative straight leg raise.  Able to lift bilateral legs without any difficulty.  Normal strength in the upper and lower extremities with normal sensation  Psychiatric:        Behavior: Behavior normal.     (all labs ordered are listed, but only abnormal results are displayed) Labs Reviewed - No data to display  EKG: EKG Interpretation Date/Time:  Friday April 17 2024 08:26:57 EDT Ventricular Rate:  78 PR Interval:  234 QRS Duration:  160 QT Interval:  463 QTC Calculation: 528 R Axis:   14  Text Interpretation: Sinus rhythm Prolonged PR interval IVCD, consider atypical LBBB No significant change since last tracing Confirmed by Doretha Folks ([id number]) on 04/17/2024 9:08:06 AM  Radiology: DG Thoracic Spine 2 View Result Date: 04/17/2024 EXAM: 2 VIEW(S) XRAY OF THE THORACIC SPINE 04/17/2024 09:50:01 AM COMPARISON: CT abdomen and pelvis 04/07/2022. CLINICAL HISTORY: Back pain. FINDINGS: BONES: Mild mid thoracic vertebral body anterior wedging, approximately the T8 level. T12 and L1 mild to moderate anterior wedging also. Other thoracic vertebral height appears maintained. Maintained cervicothoracic  junction alignment. No aggressive appearing osseous lesion. DISCS AND DEGENERATIVE CHANGES: Disc space is maintained. No severe degenerative changes. SOFT TISSUES: Previous sternotomy and CABG. The visualized lungs are clear. IMPRESSION: 1. Thoracolumbar junction level and approximately T8 compression fractures are age indeterminate. If specific therapy such as vertebroplasty is desired, MRI without contrast or a nuclear medicine whole-body bone scan would best determine acuity/candidacy for intervention. Electronically signed by: Helayne Hurst MD 04/17/2024 10:12 AM EDT RP Workstation: [id number]   DG Chest 2  View Result Date: 04/17/2024 EXAM: 2 VIEW(S) XRAY OF THE CHEST 04/17/2024 09:49:38 AM COMPARISON: December 13, 2010. CT abdomen and pelvis April 07, 2022. CLINICAL HISTORY: back pain. Back pain FINDINGS: LINES, TUBES AND DEVICES: Intact wires. LUNGS AND PLEURA: No focal pulmonary opacity. No pulmonary edema. No pleural effusion. No pneumothorax. HEART AND MEDIASTINUM: Atherosclerotic aortic calcifications. Post-CABG changes. No acute abnormality of the cardiac and mediastinal silhouettes. BONES AND SOFT TISSUES: Degenerative changes of thoracic spine. Mild anterior wedging of a mid thoracic vertebral body, moderate anterior wedging of lower thoracic or upper lumbar vertebral body. These are new since 2012. IMPRESSION: 1. No acute cardiopulmonary abnormality. 2. New since 2023 anterior wedging of a mid and lower thoracic or upper lumbar vertebral bodies. Electronically signed by: Helayne Hurst MD 04/17/2024 10:10 AM EDT RP Workstation: [id number]     Procedures   Medications Ordered in the ED - No data to display                                  Medical Decision Making Amount and/or Complexity of Data Reviewed Radiology: ordered and independent interpretation performed. Decision-making details documented in ED Course.   Pt with multiple medical problems and comorbidities and presenting today with  a complaint that caries a high risk for morbidity and mortality.  Here today with a complaint of midthoracic back pain.  Mostly laterally no central pain.  Concern for possible compression fracture versus musculoskeletal causes most likely.  The pain is only elicited with movements and he is not having pain suggestive of the AAA, deep infection within the back and lower suspicion for lung pathology.  Will do a chest x-ray to ensure no distal lesions in the lungs as well as a thoracic image to rule out compression fracture.  He is neurologically intact at this time. I have independently visualized and interpreted pt's images today.  Chest x-ray without acute findings, thoracic imaging with wedging of his vertebra.  Radiology reports thoracic to lumbar junction level and approximately T8 compression fractures are age-indeterminate.  However given patient's complaints, location of pain suspect is most likely related to compression fracture.  All of this was discussed with the patient.  At this time he is still very mobile and he is requesting a muscle relaxer.  Discussed with him the possible side effects of a muscle relaxer and that they are not recommended for elderly patients.  He reports he would still like some and he will take them sparingly.  Discussed with him he is unable to drive while taking these and he should take the first 1 while being around someone just to ensure he knows how it is going to affect him.  He is agreeable to this plan and has a follow-up with his doctor on Wednesday of next week.      Final diagnoses:  Compression fracture of T8 vertebra, initial encounter [facility])    ED Discharge Orders          Ordered    methocarbamol  (ROBAXIN ) 500 MG tablet  2 times daily        04/17/24 1037               Doretha Folks, MD 04/17/24 1037

## 2024-04-17 NOTE — Discharge Instructions (Signed)
 Your chest x-ray looked good today with normal heart and lungs.  Your back x-ray shows concern for a compression fracture which is when the bones get smashed together.  You were given a short course of muscle relaxer but they can cause drowsiness and you should never take them before driving and you should take the first 1 while they are people around to ensure how it affects you.  If you start having any difficulty walking or numbness or weakness in your arms or your legs or any pain in your stomach or trouble breathing you should return to the emergency room.

## 2024-04-17 NOTE — ED Triage Notes (Signed)
 Reports bilateral mid back pain x 3 days. Denies any other symptoms.

## 2024-04-22 ENCOUNTER — Encounter: Payer: Self-pay | Admitting: Internal Medicine

## 2024-04-22 ENCOUNTER — Non-Acute Institutional Stay: Admitting: Internal Medicine

## 2024-04-22 VITALS — BP 132/68 | HR 86 | Temp 96.7°F | Resp 18 | Ht 70.0 in | Wt 214.2 lb

## 2024-04-22 DIAGNOSIS — K219 Gastro-esophageal reflux disease without esophagitis: Secondary | ICD-10-CM

## 2024-04-22 DIAGNOSIS — S22060D Wedge compression fracture of T7-T8 vertebra, subsequent encounter for fracture with routine healing: Secondary | ICD-10-CM

## 2024-04-22 DIAGNOSIS — I10 Essential (primary) hypertension: Secondary | ICD-10-CM | POA: Diagnosis not present

## 2024-04-22 DIAGNOSIS — G8929 Other chronic pain: Secondary | ICD-10-CM

## 2024-04-22 MED ORDER — METHOCARBAMOL 500 MG PO TABS: 500.0000 mg | ORAL_TABLET | Freq: Two times a day (BID) | ORAL | 0 refills | Status: AC | PRN

## 2024-04-22 NOTE — Progress Notes (Signed)
 Location: [facility] of Service:  Clinic ([id number])  Provider:   Code Status: DNR Goals of Care:     04/17/2024    8:58 AM  Advanced Directives  Does Patient Have a Medical Advance Directive? No  Would patient like information on creating a medical advance directive? No - Patient declined     Chief Complaint  Patient presents with   Back Pain    patient is requesting muscle relaxer medication because back pain for two days    HPI: Patient is a 89 y.o. male seen today for an acute visit for ED follow up [place] in [facility] [place]   No Family Close by   Has h/o Hypertension, hyperlipidemia, lower extremity edema, CAD s/p CABG, anxiety with insomnia, allergic rhinitis, Chronic Urticaria  Chronic Itching sees Derm in [place] on Steroid cream   Went to ED on 04/17/24 for  back Pain Xray showed T8 compression fractures are age indeterminate Was send home on Robaxin  No H/o Falls or any trauma  History of Present Illness   Samuel Clayton is a 89 year old male who presents with upper back pain.  He experiences upper back pain on each side of the spine, The pain worsens with movements like getting in and out of bed or changing positions at night and improves with sitting or watching TV.  He takes Aleve, two tablets daily, and finds it effective. He uses a back brace for support and has previously benefited from muscle relaxants. He takes vitamin D , 2000 mg daily.  No recent falls or injuries have occurred, and he avoids strenuous activities. He is cautious with activities, especially driving, due to age-related safety concerns. The pain does not involve the spine itself and does not radiate to other areas.     Not driving long distance right now  Past Medical History:  Diagnosis Date   Anxiety    AV block, 1st degree 08/06/07   Back pain    Cellulitis of lower extremity 01/11/2015   Coccygodynia 01/31/10   Coronary artery disease    Diverticulosis 1980    Dupuytren contracture 08/06/07   4th finger left hand   Essential hypertension, malignant    Gastric ulcer 12/30/01   Hearing loss 01/16/05   Hyperhidrosis, focal, primary 08/06/07   Hyperlipemia    Insomnia    Internal thrombosed hemorrhoids 04/30/01   Keratosis, actinic 08/03/09   Slowing of urinary stream 08/04/07   Vitamin D  deficiency 05/27/09    Past Surgical History:  Procedure Laterality Date   CATARACT EXTRACTION W/ INTRAOCULAR LENS IMPLANT Right 1990   Dr. Carrie   CATARACT EXTRACTION W/ INTRAOCULAR LENS IMPLANT Left 1992   Dr. Carrie   COLONOSCOPY  2003   Diverticulosis   CORONARY ARTERY BYPASS GRAFT  1998   Dr Tanda at [facility]   RECTAL POLYPECTOMY  1971   Dr. Drury    Allergies  Allergen Reactions   Furosemide  Itching   Orange Juice [Orange Oil] Other (See Comments)    indegestion   Augmentin  [Amoxicillin -Pot Clavulanate] Nausea Only    Outpatient Encounter Medications as of 04/22/2024  Medication Sig   aspirin  81 MG tablet Take 81 mg by mouth daily.   cetirizine  (ZYRTEC ) 10 MG tablet TAKE 1 TABLET DAILY   Cholecalciferol  (VITAMIN D3) 50 MCG (2000 UT) TABS Take 1 tablet by mouth daily.   famotidine  (PEPCID ) 20 MG tablet TAKE 1 TABLET DAILY   metoprolol  tartrate (LOPRESSOR ) 50 MG tablet  TAKE 1 TABLET TWICE A DAY   omeprazole  (PRILOSEC) 40 MG capsule Take 1 capsule (40 mg total) by mouth daily.   potassium chloride  (KLOR-CON  M) 10 MEQ tablet TAKE 1 TABLET DAILY AS NEEDED (TAKE WITH TORSEMIDE )   Propylene Glycol (SYSTANE BALANCE) 0.6 % SOLN Apply to eye 2 (two) times daily as needed.   rosuvastatin  (CRESTOR ) 10 MG tablet Take 1 tablet (10 mg total) by mouth daily. (Patient taking differently: Take 10 mg by mouth 3 (three) times a week.)   torsemide  (DEMADEX ) 20 MG tablet TAKE ONE-HALF TABLET (10 MG) AS NEEDED   triamcinolone  cream (KENALOG ) 0.1 % Apply 1 Application topically 2 (two) times daily as needed.   [DISCONTINUED] methocarbamol  (ROBAXIN ) 500 MG tablet Take 1 tablet  (500 mg total) by mouth 2 (two) times daily.   acetaminophen  (TYLENOL  8 HOUR) 650 MG CR tablet Take 1 tablet (650 mg total) by mouth every 8 (eight) hours as needed for pain. (Patient not taking: Reported on 04/22/2024)   methocarbamol  (ROBAXIN ) 500 MG tablet Take 1 tablet (500 mg total) by mouth 2 (two) times daily as needed for muscle spasms.   [DISCONTINUED] potassium chloride  (K-DUR) 10 MEQ tablet Take 1 tablet (10 mEq total) by mouth daily as needed (take with torsemide ).   No facility-administered encounter medications on file as of 04/22/2024.    Review of Systems:  Review of Systems  Constitutional:  Negative for activity change, appetite change and unexpected weight change.  HENT: Negative.    Respiratory:  Negative for cough and shortness of breath.   Cardiovascular:  Negative for leg swelling.  Gastrointestinal:  Negative for constipation.  Genitourinary:  Negative for frequency.  Musculoskeletal:  Positive for back pain. Negative for arthralgias, gait problem and myalgias.  Skin: Negative.  Negative for rash.  Neurological:  Negative for dizziness and weakness.  Psychiatric/Behavioral:  Negative for confusion and sleep disturbance.   All other systems reviewed and are negative.   Health Maintenance  Topic Date Due   Medicare Annual Wellness (AWV)  Never done   COVID-19 Vaccine (7 - 2025-26 season) 02/24/2024   Zoster Vaccines- Shingrix (2 of 2) 04/24/2024   DTaP/Tdap/Td (5 - Td or Tdap) 04/08/2032   Pneumococcal Vaccine: 50+ Years  Completed   Influenza Vaccine  Completed   Meningococcal B Vaccine  Aged Out    Physical Exam: Vitals:   04/22/24 1015  BP: 132/68  Pulse: 86  Resp: 18  Temp: (!) 96.7 F (35.9 C)  SpO2: 92%  Weight: 214 lb 3.2 oz (97.2 kg)  Height: 5' 10 (1.778 m)   Body mass index is 30.73 kg/m. Physical Exam Vitals reviewed.  Constitutional:      Appearance: Normal appearance.  HENT:     Head: Normocephalic.     Nose: Nose normal.      Mouth/Throat:     Mouth: Mucous membranes are moist.     Pharynx: Oropharynx is clear.  Eyes:     Pupils: Pupils are equal, round, and reactive to light.  Cardiovascular:     Rate and Rhythm: Normal rate and regular rhythm.     Pulses: Normal pulses.     Heart sounds: No murmur heard. Pulmonary:     Effort: Pulmonary effort is normal. No respiratory distress.     Breath sounds: Normal breath sounds. No rales.  Abdominal:     General: Abdomen is flat. Bowel sounds are normal.     Palpations: Abdomen is soft.  Musculoskeletal:  General: Swelling present.     Cervical back: Neck supple.     Comments: No tenderness in the back  Skin:    General: Skin is warm.  Neurological:     General: No focal deficit present.     Mental Status: He is alert and oriented to person, place, and time.  Psychiatric:        Mood and Affect: Mood normal.        Thought Content: Thought content normal.     Labs reviewed: Basic Metabolic Panel: Recent Labs    02/10/24 0000  NA 140  K 4.2  CL 107  CO2 26  GLUCOSE 98  BUN 15  CREATININE 1.00  CALCIUM  9.0  TSH 3.79   Liver Function Tests: Recent Labs    02/10/24 0000  AST 17  ALT 14  BILITOT 0.6  PROT 6.4   No results for input(s): LIPASE, AMYLASE in the last 8760 hours. No results for input(s): AMMONIA in the last 8760 hours. CBC: Recent Labs    02/10/24 0000  WBC 10.6  NEUTROABS 6,074  HGB 14.4  HCT 43.8  MCV 96.3  PLT 172   Lipid Panel: Recent Labs    02/10/24 0000  CHOL 172  HDL 50  LDLCALC 92  TRIG 198*  CHOLHDL 3.4   Lab Results  Component Value Date   HGBA1C 6.5 (H) 06/07/2022    Procedures since last visit: DG Thoracic Spine 2 View Result Date: 04/17/2024 EXAM: 2 VIEW(S) XRAY OF THE THORACIC SPINE 04/17/2024 09:50:01 AM COMPARISON: CT abdomen and pelvis 04/07/2022. CLINICAL HISTORY: Back pain. FINDINGS: BONES: Mild mid thoracic vertebral body anterior wedging, approximately the T8 level. T12  and L1 mild to moderate anterior wedging also. Other thoracic vertebral height appears maintained. Maintained cervicothoracic junction alignment. No aggressive appearing osseous lesion. DISCS AND DEGENERATIVE CHANGES: Disc space is maintained. No severe degenerative changes. SOFT TISSUES: Previous sternotomy and CABG. The visualized lungs are clear. IMPRESSION: 1. Thoracolumbar junction level and approximately T8 compression fractures are age indeterminate. If specific therapy such as vertebroplasty is desired, MRI without contrast or a nuclear medicine whole-body bone scan would best determine acuity/candidacy for intervention. Electronically signed by: Helayne Hurst MD 04/17/2024 10:12 AM EDT RP Workstation: [id number]   DG Chest 2 View Result Date: 04/17/2024 EXAM: 2 VIEW(S) XRAY OF THE CHEST 04/17/2024 09:49:38 AM COMPARISON: December 13, 2010. CT abdomen and pelvis April 07, 2022. CLINICAL HISTORY: back pain. Back pain FINDINGS: LINES, TUBES AND DEVICES: Intact wires. LUNGS AND PLEURA: No focal pulmonary opacity. No pulmonary edema. No pleural effusion. No pneumothorax. HEART AND MEDIASTINUM: Atherosclerotic aortic calcifications. Post-CABG changes. No acute abnormality of the cardiac and mediastinal silhouettes. BONES AND SOFT TISSUES: Degenerative changes of thoracic spine. Mild anterior wedging of a mid thoracic vertebral body, moderate anterior wedging of lower thoracic or upper lumbar vertebral body. These are new since 2012. IMPRESSION: 1. No acute cardiopulmonary abnormality. 2. New since 2023 anterior wedging of a mid and lower thoracic or upper lumbar vertebral bodies. Electronically signed by: Helayne Hurst MD 04/17/2024 10:10 AM EDT RP Workstation: [id number]    Assessment/Plan   Assessment and Plan    Thoracic vertebral compression fracture Pain improving but still present, especially during movement.   Robaxin  PRN helping - Continue supportive belt for a few more days, discontinue if pain  improves.  - Consider bone density check if pain recurs. He is refusing - Avoid long trips and limit driving during recovery. - Contact office if pain  worsens or persists beyond two weeks.  Chronic low back pain Chronic pain in lower back and hips, new thoracic pain. Aleve provides relief but has potential gastrointestinal and renal side effects. Advised taking Aleve with food  - Continue Aleve as needed with food. Already on Prilosec - Limit Aleve use to short periods to avoid side effects.        Labs/tests ordered:  * No order type specified * Next appt:  Visit date not found

## 2024-04-26 DIAGNOSIS — S22000D Wedge compression fracture of unspecified thoracic vertebra, subsequent encounter for fracture with routine healing: Secondary | ICD-10-CM | POA: Insufficient documentation

## 2024-05-02 ENCOUNTER — Other Ambulatory Visit: Payer: Self-pay | Admitting: Internal Medicine

## 2024-06-22 ENCOUNTER — Other Ambulatory Visit: Payer: Self-pay | Admitting: Internal Medicine

## 2024-06-30 ENCOUNTER — Other Ambulatory Visit: Payer: Self-pay | Admitting: Internal Medicine

## 2024-07-13 ENCOUNTER — Other Ambulatory Visit: Payer: Self-pay | Admitting: Internal Medicine

## 2024-07-13 NOTE — Telephone Encounter (Signed)
Pharmacy requested refill.  Pended Rx's and sent to Dr. Gupta for approval due to HIGH ALERT Warning.  

## 2024-08-12 ENCOUNTER — Encounter: Payer: Self-pay | Admitting: Internal Medicine
